# Patient Record
Sex: Female | Born: 1990 | Race: Black or African American | Hispanic: No | Marital: Single | Smoking: Never smoker
Health system: Southern US, Community
[De-identification: ages and names within clinical notes are randomized; demographics above are authoritative.]

## PROBLEM LIST (undated history)

## (undated) DIAGNOSIS — R188 Other ascites: Secondary | ICD-10-CM

## (undated) DIAGNOSIS — J302 Other seasonal allergic rhinitis: Secondary | ICD-10-CM

## (undated) DIAGNOSIS — M329 Systemic lupus erythematosus, unspecified: Secondary | ICD-10-CM

## (undated) HISTORY — PX: NO PAST SURGERIES: SHX2092

---
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
## 2011-06-04 ENCOUNTER — Encounter: Payer: Self-pay | Admitting: Obstetrics and Gynecology

## 2011-06-10 ENCOUNTER — Ambulatory Visit: Payer: Self-pay | Admitting: Obstetrics and Gynecology

## 2011-06-20 ENCOUNTER — Ambulatory Visit (INDEPENDENT_AMBULATORY_CARE_PROVIDER_SITE_OTHER): Payer: 59 | Admitting: Obstetrics and Gynecology

## 2011-06-20 DIAGNOSIS — Z01419 Encounter for gynecological examination (general) (routine) without abnormal findings: Secondary | ICD-10-CM

## 2011-06-20 DIAGNOSIS — Z202 Contact with and (suspected) exposure to infections with a predominantly sexual mode of transmission: Secondary | ICD-10-CM

## 2015-02-10 ENCOUNTER — Ambulatory Visit (INDEPENDENT_AMBULATORY_CARE_PROVIDER_SITE_OTHER): Payer: Commercial Managed Care - HMO | Admitting: Family Medicine

## 2015-02-10 VITALS — BP 120/72 | HR 104 | Temp 98.7°F | Resp 20 | Ht 66.0 in | Wt 199.2 lb

## 2015-02-10 DIAGNOSIS — M76899 Other specified enthesopathies of unspecified lower limb, excluding foot: Secondary | ICD-10-CM

## 2015-02-10 DIAGNOSIS — S161XXA Strain of muscle, fascia and tendon at neck level, initial encounter: Secondary | ICD-10-CM

## 2015-02-10 DIAGNOSIS — M6580 Other synovitis and tenosynovitis, unspecified site: Secondary | ICD-10-CM | POA: Diagnosis not present

## 2015-02-10 MED ORDER — PREDNISONE 20 MG PO TABS: ORAL_TABLET | ORAL | Status: DC

## 2015-02-10 NOTE — Progress Notes (Signed)
This chart was scribed for Elvina SidleKurt Lauenstein, MD by Stann Oresung-Kai Tsai, medical scribe at [facility].The patient was seen in exam room 5 and the patient's care was started at 9:41 AM.  Patient ID: Caroline Elliott MRN: [id number], DOB: [birth year], 24 y.o. Date of Encounter: 02/10/2015  Primary Physician: Hal MoralesHAYGOOD,VANESSA P, MD  Chief Complaint:  Chief Complaint  Patient presents with   Knee Injury    pinching sensation, last friday    Neck Pain   Arm Pain    fingers swollen     HPI:  Caroline Elliott is a 24 y.o. female who [facility] complaining of multiple joint aches.  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 She noticed aching pain in her knees 4 days after. It was hurting really bad where she can't move it. When she was at work during the past week, she works with children and tried to kneel down, she couldn't get back up. She needed assistance to get back up.   When she goes to sleep, the right side of her neck hurts really bad for past 7 days.   When she bends her left arm, it hurts and noted her fingers swelling. She denies numbness and weakness in arms.   She was brought in by her mother.   Past Medical History  Diagnosis Date   Allergy      Home Meds: Prior to Admission medications   Medication Sig Start Date End Date Taking? Authorizing Provider  cetirizine (ZYRTEC) 1 MG/ML syrup Take by mouth daily.   Yes Historical Provider, MD  Multiple Vitamin (MULTIVITAMIN) tablet Take 1 tablet by mouth daily.   Yes Historical Provider, MD    Allergies:  Allergies  Allergen Reactions   Penicillins     Social History   Social History   Marital Status: Single    Spouse Name: N/A   Number of Children: N/A   Years of Education: N/A   Occupational History   Not on file.   Social History Main Topics   Smoking status: Never Smoker    Smokeless tobacco: Not on file   Alcohol Use: No    Drug Use: No   Sexual Activity: Not on file   Other Topics Concern   Not on file   Social History Narrative   No narrative on file     Review of Systems: Constitutional: negative for fever, chills, night sweats, weight changes, or fatigue  HEENT: negative for vision changes, hearing loss, congestion, rhinorrhea, ST, epistaxis, or sinus pressure Cardiovascular: negative for chest pain or palpitations Respiratory: negative for hemoptysis, wheezing, shortness of breath, or cough Abdominal: negative for abdominal pain, nausea, vomiting, diarrhea, or constipation Dermatological: negative for rash Neurologic: negative for headache, dizziness, or syncope, negative for numbness, weakness Musc: positive for arthralgia (knee, neck), myalgia (left arm)  All other systems reviewed and are otherwise negative with the exception to those above and in the HPI.  Physical Exam: Blood pressure 120/72, pulse 104, temperature 98.7 F (37.1 C), temperature source Oral, resp. rate 20, height 5\' 6"  (1.676 m), weight 199 lb 3.2 oz (90.357 kg), last menstrual period 02/07/2015, SpO2 98 %., Body mass index is 32.17 kg/(m^2). General: Well developed, well nourished, in no acute distress. Head: Normocephalic, atraumatic, eyes without discharge, sclera non-icteric, nares are without discharge. Bilateral auditory canals clear, TM's are without perforation, pearly grey and translucent with reflective cone of light bilaterally.  Oral cavity moist, posterior pharynx without exudate, erythema, peritonsillar abscess, or post nasal drip.  Neck: Supple. No thyromegaly. No lymphadenopathy.pain with palpation in paraspinal region of right lower neck. Patient reluctant to hyperextend the neck as well. Lungs: Clear bilaterally to auscultation without wheezes, rales, or rhonchi. Breathing is unlabored. Heart: RRR with S1 S2. No murmurs, rubs, or gallops appreciated. Abdomen: Soft, non-tender, non-distended with normoactive  bowel sounds. No hepatomegaly. No rebound/guarding. No obvious abdominal masses. Msk:  Strength normal for age. Good range of motion of knees although she does is slowly Extremities/Skin: Warm and dry. No clubbing or cyanosis.  Neuro: Alert and oriented X 3. Moves all extremities spontaneously. Gait is normal. CNII-XII grossly in tact. Psych:  Responds to questions appropriately with a normal affect.    ASSESSMENT AND PLAN:  24 y.o. year old female with  This chart was scribed in my presence and reviewed by me personally.    ICD-9-CM ICD-10-CM   1. Neck strain, initial encounter 847.0 S16.1XXA predniSONE (DELTASONE) 20 MG tablet  2. Tendonitis of knee 727.09 M65.80 predniSONE (DELTASONE) 20 MG tablet      By signing my name below, I, Stann Ore, attest that this documentation has been prepared under the direction and in the presence of Elvina Sidle, MD. Electronically Signed: Stann Ore, Scribe. 02/10/2015 , 9:41 AM .  Signed, Elvina Sidle, MD 02/10/2015 9:41 AM

## 2015-02-10 NOTE — Patient Instructions (Signed)
I sent the prescriptions to  Your pharmacy.  Please let me know if you're not significantly better tomorrow.  please take the medicine for 5 days to completely stopped inflammation in the joints of your neck and knees.

## 2015-08-23 ENCOUNTER — Ambulatory Visit (INDEPENDENT_AMBULATORY_CARE_PROVIDER_SITE_OTHER): Payer: Commercial Managed Care - HMO | Admitting: Physician Assistant

## 2015-08-23 VITALS — BP 124/82 | HR 98 | Temp 98.2°F | Resp 18 | Ht 66.0 in | Wt 170.0 lb

## 2015-08-23 DIAGNOSIS — H109 Unspecified conjunctivitis: Secondary | ICD-10-CM | POA: Diagnosis not present

## 2015-08-23 DIAGNOSIS — J302 Other seasonal allergic rhinitis: Secondary | ICD-10-CM | POA: Insufficient documentation

## 2015-08-23 DIAGNOSIS — Z6827 Body mass index (BMI) 27.0-27.9, adult: Secondary | ICD-10-CM | POA: Insufficient documentation

## 2015-08-23 DIAGNOSIS — K219 Gastro-esophageal reflux disease without esophagitis: Secondary | ICD-10-CM | POA: Insufficient documentation

## 2015-08-23 NOTE — Patient Instructions (Addendum)
Continue wearing your glasses until the symptoms resolve. If you continue to have symptoms on Monday, please contact your eye specialist. If you develop colored drainage from your eye (thick, gooey, green/yellow), please let me know.  Drink lots of water. You may use Naproxen to help reduce the aching. Use OTC Systane Ultra rewetting drops to help soothe the eye.    IF you received an x-ray today, you will receive an invoice from [facility]. Please contact [facility] at [phone] with questions or concerns regarding your invoice.   IF you received labwork today, you will receive an invoice from [facility]. Please contact [facility] at [phone] with questions or concerns regarding your invoice.   Our billing staff will not be able to assist you with questions regarding bills from these companies.  You will be contacted with the lab results as soon as they are available. The fastest way to get your results is to activate your My Chart account. Instructions are located on the last page of this paperwork. If you have not heard from us regarding the results in 2 weeks, please contact this office.

## 2015-08-23 NOTE — Progress Notes (Signed)
Patient ID: Caroline Elliott, female    DOB: Jun 22, 1990, 25 y.o.   MRN: [id number]  PCP: Hal MoralesHAYGOOD,VANESSA P, MD  Subjective:   Chief Complaint  Patient presents with  . Conjunctivitis    3 DAYS    HPI Presents for evaluation of LEFT eye redness.  Since Tuesday morning (2 days ago) noticed a throbbing sensation around the LEFT globe and it was watering. Did not insert her contacts. Used some eye drops for the redness. Yesterday at work someone commented that her eye looked really red. "Achy." "Throbbing" around the orbit. Continues to drain clear fluid, like tears.  No fever, chills. Some coughing last night, but no other URI-type symptoms.  No recalled eye injury, FB or extended wear of contact lenses.     Review of Systems As above.    Patient Active Problem List   Diagnosis Date Noted  . Seasonal allergies 08/23/2015  . BMI 27.0-27.9,adult 08/23/2015  . GERD (gastroesophageal reflux disease) 08/23/2015     Prior to Admission medications   Medication Sig Start Date End Date Taking? Authorizing Provider  cetirizine (ZYRTEC) 10 MG tablet Take 10 mg by mouth daily.   Yes Historical Provider, MD  Multiple Vitamin (MULTIVITAMIN) tablet Take 1 tablet by mouth daily.   Yes Historical Provider, MD  naproxen sodium (ANAPROX) 550 MG tablet Take 550 mg by mouth every 12 (twelve) hours. Use PRN menstrual cramps 07/03/15   Historical Provider, MD  omeprazole (PRILOSEC) 40 MG capsule Take 40 mg by mouth 2 (two) times daily. 08/17/15   Historical Provider, MD     Allergies  Allergen Reactions  . Penicillins        Objective:  Physical Exam  Constitutional: She is oriented to person, place, and time. She appears well-developed and well-nourished. She is active and cooperative. No distress.  BP 124/82 mmHg  Pulse 98  Temp(Src) 98.2 F (36.8 C) (Oral)  Resp 18  Ht 5\' 6"  (1.676 m)  Wt 170 lb (77.111 kg)  BMI 27.45 kg/m2  SpO2 99%  LMP 08/14/2015   HENT:  Head:  Normocephalic and atraumatic.  Right Ear: Hearing, tympanic membrane, external ear and ear canal normal.  Left Ear: Hearing, tympanic membrane, external ear and ear canal normal.  Nose: Nose normal.  Mouth/Throat: Uvula is midline, oropharynx is clear and moist and mucous membranes are normal. No oral lesions. Normal dentition.  During irrigation of the eye, the gauze caught the patient's nasal piercing and pulled it out of the nare. Pressure applied. The patient was provided alcohol prep pad to clean the jewelry and she re-inserted it without difficulty.  Eyes: EOM and lids are normal. Pupils are equal, round, and reactive to light. Right eye exhibits no chemosis, no discharge, no exudate and no hordeolum. No foreign body present in the right eye. Left eye exhibits no discharge, no exudate and no hordeolum. No foreign body present in the left eye. Right conjunctiva is not injected. Right conjunctiva has no hemorrhage. Left conjunctiva is injected. Left conjunctiva has no hemorrhage. No scleral icterus.  Fundoscopic exam:      The right eye shows no hemorrhage and no papilledema. The right eye shows red reflex.       The left eye shows no hemorrhage and no papilledema. The left eye shows red reflex.  Verbal consent obtained.  The eye was anesthetized with 1 drop of proparacaine, and stained with fluorescein. Examination under woods lamp does not reveal a foreign body or area of increased  stain uptake.  The eye was then irrigated copiously with saline.   Pulmonary/Chest: Effort normal.  Neurological: She is alert and oriented to person, place, and time.  Psychiatric: She has a normal mood and affect. Her speech is normal and behavior is normal.      Visual Acuity Screening   Right eye Left eye Both eyes  Without correction:     With correction:         Assessment & Plan:   1. Conjunctivitis of left eye Suspect dry eye condition. Continue wearing glasses in lieu of  contacts until her symptoms resolve. Systane Ultra. Naproxen. Hydrate. If she develops thick colored drainage, let me know, as she may warrant antibiotic drops, but at this time there is no infection. If her symptoms persist throughout the weekend, contact her eye specialist on 08/27/15.   Fernande Bras, PA-C Physician Assistant-Certified [facility]

## 2015-08-23 NOTE — Progress Notes (Deleted)
[facility] [address], [place] [address] [phone]  Date:  08/23/2015   Name:  Caroline PootMichelle N Elliott   DOB:  1990-06-08   MRN:  [id number]  PCP:  Caroline MoralesHAYGOOD,VANESSA P, MD    Chief Complaint: Conjunctivitis   History of Present Illness:  This is a 25 y.o. female who is presenting with "eye infection" x 3 days.  Review of Systems:  Review of Systems See HPI  There are no active problems to display for this patient.   Prior to Admission medications   Medication Sig Start Date End Date Taking? Authorizing Provider  cetirizine (ZYRTEC) 1 MG/ML syrup Take by mouth daily.   Yes Historical Provider, MD  Multiple Vitamin (MULTIVITAMIN) tablet Take 1 tablet by mouth daily.   Yes Historical Provider, MD    Allergies  Allergen Reactions  . Penicillins     History reviewed. No pertinent past surgical history.  Social History  Substance Use Topics  . Smoking status: Never Smoker   . Smokeless tobacco: None  . Alcohol Use: No    Family History  Problem Relation Age of Onset  . Stroke Father     Medication list has been reviewed and updated.  Physical Examination:  Physical Exam  BP 124/82 mmHg  Pulse 98  Temp(Src) 98.2 F (36.8 C) (Oral)  Resp 18  Ht 5\' 6"  (1.676 m)  Wt 170 lb (77.111 kg)  BMI 27.45 kg/m2  SpO2 99%  LMP 08/14/2015  Assessment and Plan:

## 2015-09-14 ENCOUNTER — Other Ambulatory Visit (HOSPITAL_COMMUNITY): Payer: Self-pay | Admitting: Gastroenterology

## 2015-09-14 DIAGNOSIS — R6881 Early satiety: Secondary | ICD-10-CM

## 2015-09-14 DIAGNOSIS — R634 Abnormal weight loss: Secondary | ICD-10-CM

## 2015-09-20 ENCOUNTER — Ambulatory Visit (HOSPITAL_COMMUNITY): Admission: RE | Admit: 2015-09-20 | Payer: Commercial Managed Care - HMO | Source: Ambulatory Visit

## 2015-10-09 ENCOUNTER — Emergency Department (HOSPITAL_COMMUNITY)
Admission: EM | Admit: 2015-10-09 | Discharge: 2015-10-09 | Disposition: A | Discharge status: Home / Self Care | Payer: Commercial Managed Care - HMO | Source: Home / Self Care | Attending: Emergency Medicine | Admitting: Emergency Medicine

## 2015-10-09 ENCOUNTER — Encounter (HOSPITAL_COMMUNITY): Payer: Self-pay | Admitting: Emergency Medicine

## 2015-10-09 DIAGNOSIS — K219 Gastro-esophageal reflux disease without esophagitis: Secondary | ICD-10-CM

## 2015-10-09 DIAGNOSIS — R188 Other ascites: Secondary | ICD-10-CM | POA: Diagnosis not present

## 2015-10-09 DIAGNOSIS — Z79899 Other long term (current) drug therapy: Secondary | ICD-10-CM | POA: Insufficient documentation

## 2015-10-09 DIAGNOSIS — M3214 Glomerular disease in systemic lupus erythematosus: Secondary | ICD-10-CM | POA: Diagnosis not present

## 2015-10-09 LAB — LIPASE, BLOOD: Lipase: 20 U/L (ref 11–51)

## 2015-10-09 LAB — COMPREHENSIVE METABOLIC PANEL WITH GFR
ALT: 10 U/L — ABNORMAL LOW (ref 14–54)
AST: 18 U/L (ref 15–41)
Albumin: 2 g/dL — ABNORMAL LOW (ref 3.5–5.0)
Alkaline Phosphatase: 48 U/L (ref 38–126)
Anion gap: 5 (ref 5–15)
BUN: 12 mg/dL (ref 6–20)
CO2: 25 mmol/L (ref 22–32)
Calcium: 7.7 mg/dL — ABNORMAL LOW (ref 8.9–10.3)
Chloride: 108 mmol/L (ref 101–111)
Creatinine, Ser: 0.94 mg/dL (ref 0.44–1.00)
GFR calc Af Amer: >60 mL/min
GFR calc non Af Amer: >60 mL/min
Glucose, Bld: 91 mg/dL (ref 65–99)
Potassium: 3.4 mmol/L — ABNORMAL LOW (ref 3.5–5.1)
Sodium: 138 mmol/L (ref 135–145)
Total Bilirubin: 0.5 mg/dL (ref 0.3–1.2)
Total Protein: 5.4 g/dL — ABNORMAL LOW (ref 6.5–8.1)

## 2015-10-09 LAB — CBC WITH DIFFERENTIAL/PLATELET
Basophils Absolute: 0 K/uL (ref 0.0–0.1)
Basophils Relative: 0 %
Eosinophils Absolute: 0 K/uL (ref 0.0–0.7)
Eosinophils Relative: 0 %
HCT: 30.5 % — ABNORMAL LOW (ref 36.0–46.0)
Hemoglobin: 10.4 g/dL — ABNORMAL LOW (ref 12.0–15.0)
Lymphocytes Relative: 21 %
Lymphs Abs: 0.8 K/uL (ref 0.7–4.0)
MCH: 27.2 pg (ref 26.0–34.0)
MCHC: 34.1 g/dL (ref 30.0–36.0)
MCV: 79.8 fL (ref 78.0–100.0)
Monocytes Absolute: 0.3 K/uL (ref 0.1–1.0)
Monocytes Relative: 8 %
Neutro Abs: 2.5 K/uL (ref 1.7–7.7)
Neutrophils Relative %: 71 %
Platelets: 210 K/uL (ref 150–400)
RBC: 3.82 MIL/uL — ABNORMAL LOW (ref 3.87–5.11)
RDW: 14.1 % (ref 11.5–15.5)
WBC: 3.6 K/uL — ABNORMAL LOW (ref 4.0–10.5)

## 2015-10-09 LAB — URINE MICROSCOPIC-ADD ON

## 2015-10-09 LAB — URINALYSIS, ROUTINE W REFLEX MICROSCOPIC
Bilirubin Urine: NEGATIVE
Glucose, UA: NEGATIVE mg/dL
Ketones, ur: NEGATIVE mg/dL
Leukocytes, UA: NEGATIVE
Nitrite: NEGATIVE
Protein, ur: >300 mg/dL — AB
Specific Gravity, Urine: 1.021 (ref 1.005–1.030)
pH: 7.5 (ref 5.0–8.0)

## 2015-10-09 LAB — PREGNANCY, URINE: Preg Test, Ur: NEGATIVE

## 2015-10-09 MED ORDER — ONDANSETRON 4 MG PO TBDP: 4.0000 mg | ORAL_TABLET | Freq: Three times a day (TID) | ORAL | Status: AC | PRN

## 2015-10-09 MED ORDER — GI COCKTAIL ~~LOC~~
30.0000 mL | Freq: Once | ORAL | Status: AC
  Administered 2015-10-09: 30 mL via ORAL
  Filled 2015-10-09: qty 30

## 2015-10-09 MED ORDER — FAMOTIDINE IN NACL 20-0.9 MG/50ML-% IV SOLN
20.0000 mg | Freq: Once | INTRAVENOUS | Status: AC
  Administered 2015-10-09: 20 mg via INTRAVENOUS
  Filled 2015-10-09: qty 50

## 2015-10-09 MED ORDER — MORPHINE SULFATE (PF) 2 MG/ML IV SOLN
2.0000 mg | Freq: Once | INTRAVENOUS | Status: AC
  Administered 2015-10-09: 2 mg via INTRAVENOUS
  Filled 2015-10-09: qty 1

## 2015-10-09 MED ORDER — ONDANSETRON HCL 4 MG/2ML IJ SOLN
4.0000 mg | Freq: Once | INTRAMUSCULAR | Status: AC
  Administered 2015-10-09: 4 mg via INTRAVENOUS
  Filled 2015-10-09: qty 2

## 2015-10-09 MED ORDER — SUCRALFATE 1 GM/10ML PO SUSP: 1.0000 g | Freq: Three times a day (TID) | ORAL | Status: DC

## 2015-10-09 MED ORDER — SUCRALFATE 1 G PO TABS
1.0000 g | ORAL_TABLET | Freq: Once | ORAL | Status: AC
  Administered 2015-10-09: 1 g via ORAL
  Filled 2015-10-09: qty 1

## 2015-10-09 MED ORDER — RANITIDINE HCL 150 MG PO TABS: 150.0000 mg | ORAL_TABLET | Freq: Two times a day (BID) | ORAL | Status: AC
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
## 2015-10-09 NOTE — ED Notes (Signed)
Given last RX and pt is discharge vis wheelchair

## 2015-10-09 NOTE — ED Notes (Signed)
Holding discharge - pending additional RX and medication

## 2015-10-09 NOTE — Discharge Instructions (Signed)

## 2015-10-09 NOTE — ED Notes (Signed)
Patient states that lower back was hurting and she took muscle relaxer (Flexiril) around 2100 on 10/08/2015. Patient states that she started getting a burning sensation in mid abdomen. Then she started throwing up. Pain is in her mid abdomen right now.

## 2015-10-09 NOTE — ED Notes (Signed)
ED PA at bedside

## 2015-10-11 ENCOUNTER — Other Ambulatory Visit: Payer: Self-pay | Admitting: Gastroenterology

## 2015-10-11 ENCOUNTER — Inpatient Hospital Stay (HOSPITAL_COMMUNITY)
Admission: AD | Admit: 2015-10-11 | Discharge: 2015-10-24 | DRG: 545 | Disposition: A | Discharge status: Other Acute Inpatient Hospital | Payer: Commercial Managed Care - HMO | Source: Ambulatory Visit | Attending: Internal Medicine | Admitting: Internal Medicine

## 2015-10-11 ENCOUNTER — Ambulatory Visit
Admission: RE | Admit: 2015-10-11 | Discharge: 2015-10-11 | Disposition: A | Discharge status: Home / Self Care | Payer: Commercial Managed Care - HMO | Source: Ambulatory Visit | Attending: Gastroenterology | Admitting: Gastroenterology

## 2015-10-11 ENCOUNTER — Encounter (HOSPITAL_COMMUNITY): Payer: Self-pay | Admitting: General Practice

## 2015-10-11 DIAGNOSIS — K567 Ileus, unspecified: Secondary | ICD-10-CM | POA: Diagnosis present

## 2015-10-11 DIAGNOSIS — R1011 Right upper quadrant pain: Secondary | ICD-10-CM

## 2015-10-11 DIAGNOSIS — E860 Dehydration: Secondary | ICD-10-CM | POA: Diagnosis present

## 2015-10-11 DIAGNOSIS — R140 Abdominal distension (gaseous): Secondary | ICD-10-CM

## 2015-10-11 DIAGNOSIS — R1110 Vomiting, unspecified: Secondary | ICD-10-CM

## 2015-10-11 DIAGNOSIS — Z880 Allergy status to penicillin: Secondary | ICD-10-CM

## 2015-10-11 DIAGNOSIS — R1013 Epigastric pain: Secondary | ICD-10-CM | POA: Diagnosis present

## 2015-10-11 DIAGNOSIS — Z888 Allergy status to other drugs, medicaments and biological substances status: Secondary | ICD-10-CM

## 2015-10-11 DIAGNOSIS — K219 Gastro-esophageal reflux disease without esophagitis: Secondary | ICD-10-CM

## 2015-10-11 DIAGNOSIS — R110 Nausea: Secondary | ICD-10-CM

## 2015-10-11 DIAGNOSIS — M3214 Glomerular disease in systemic lupus erythematosus: Secondary | ICD-10-CM

## 2015-10-11 DIAGNOSIS — R188 Other ascites: Secondary | ICD-10-CM | POA: Diagnosis present

## 2015-10-11 DIAGNOSIS — Y848 Other medical procedures as the cause of abnormal reaction of the patient, or of later complication, without mention of misadventure at the time of the procedure: Secondary | ICD-10-CM | POA: Diagnosis not present

## 2015-10-11 DIAGNOSIS — K5900 Constipation, unspecified: Secondary | ICD-10-CM

## 2015-10-11 DIAGNOSIS — R112 Nausea with vomiting, unspecified: Secondary | ICD-10-CM | POA: Diagnosis present

## 2015-10-11 DIAGNOSIS — K529 Noninfective gastroenteritis and colitis, unspecified: Secondary | ICD-10-CM | POA: Diagnosis not present

## 2015-10-11 DIAGNOSIS — T508X5A Adverse effect of diagnostic agents, initial encounter: Secondary | ICD-10-CM | POA: Diagnosis not present

## 2015-10-11 DIAGNOSIS — R1012 Left upper quadrant pain: Secondary | ICD-10-CM

## 2015-10-11 DIAGNOSIS — N179 Acute kidney failure, unspecified: Secondary | ICD-10-CM | POA: Diagnosis not present

## 2015-10-11 DIAGNOSIS — N170 Acute kidney failure with tubular necrosis: Secondary | ICD-10-CM | POA: Diagnosis present

## 2015-10-11 DIAGNOSIS — K5909 Other constipation: Secondary | ICD-10-CM | POA: Diagnosis present

## 2015-10-11 DIAGNOSIS — R591 Generalized enlarged lymph nodes: Secondary | ICD-10-CM | POA: Diagnosis present

## 2015-10-11 DIAGNOSIS — Z4659 Encounter for fitting and adjustment of other gastrointestinal appliance and device: Secondary | ICD-10-CM

## 2015-10-11 DIAGNOSIS — E875 Hyperkalemia: Secondary | ICD-10-CM | POA: Diagnosis present

## 2015-10-11 HISTORY — DX: Other ascites: R18.8

## 2015-10-11 HISTORY — DX: Other seasonal allergic rhinitis: J30.2

## 2015-10-11 LAB — C-REACTIVE PROTEIN: CRP: <0.5 mg/dL (ref <1.0)

## 2015-10-11 LAB — COMPREHENSIVE METABOLIC PANEL
ALK PHOS: 51 U/L (ref 38–126)
ALT: 9 U/L — AB (ref 14–54)
AST: 25 U/L (ref 15–41)
Albumin: 2 g/dL — ABNORMAL LOW (ref 3.5–5.0)
Anion gap: 6 (ref 5–15)
BILIRUBIN TOTAL: 0.4 mg/dL (ref 0.3–1.2)
BUN: 34 mg/dL — AB (ref 6–20)
CALCIUM: 8.4 mg/dL — AB (ref 8.9–10.3)
CHLORIDE: 104 mmol/L (ref 101–111)
CO2: 27 mmol/L (ref 22–32)
CREATININE: 1.51 mg/dL — AB (ref 0.44–1.00)
GFR, EST AFRICAN AMERICAN: 55 mL/min — AB (ref >60)
GFR, EST NON AFRICAN AMERICAN: 48 mL/min — AB (ref >60)
Glucose, Bld: 103 mg/dL — ABNORMAL HIGH (ref 65–99)
Potassium: 4.1 mmol/L (ref 3.5–5.1)
Sodium: 137 mmol/L (ref 135–145)
TOTAL PROTEIN: 5.6 g/dL — AB (ref 6.5–8.1)

## 2015-10-11 LAB — CBC WITH DIFFERENTIAL/PLATELET
BASOS ABS: 0 10*3/uL (ref 0.0–0.1)
BASOS PCT: 1 %
EOS ABS: 0 10*3/uL (ref 0.0–0.7)
Eosinophils Relative: 0 %
HCT: 40.1 % (ref 36.0–46.0)
Hemoglobin: 13.5 g/dL (ref 12.0–15.0)
Lymphocytes Relative: 17 %
Lymphs Abs: 0.9 10*3/uL (ref 0.7–4.0)
MCH: 27 pg (ref 26.0–34.0)
MCHC: 33.7 g/dL (ref 30.0–36.0)
MCV: 80.2 fL (ref 78.0–100.0)
Monocytes Absolute: 0.4 10*3/uL (ref 0.1–1.0)
Monocytes Relative: 7 %
Neutro Abs: 3.7 10*3/uL (ref 1.7–7.7)
Neutrophils Relative %: 74 %
PLATELETS: 208 10*3/uL (ref 150–400)
RBC: 5 MIL/uL (ref 3.87–5.11)
RDW: 14.2 % (ref 11.5–15.5)
WBC: 5 10*3/uL (ref 4.0–10.5)

## 2015-10-11 LAB — TSH: TSH: 2.92 u[IU]/mL (ref 0.350–4.500)

## 2015-10-11 LAB — MAGNESIUM: MAGNESIUM: 1.8 mg/dL (ref 1.7–2.4)

## 2015-10-11 LAB — SEDIMENTATION RATE: SED RATE: 27 mm/h — AB (ref 0–22)

## 2015-10-11 MED ORDER — ENSURE ENLIVE PO LIQD: 237.0000 mL | Freq: Two times a day (BID) | ORAL | Status: DC

## 2015-10-11 MED ORDER — IOPAMIDOL (ISOVUE-300) INJECTION 61%
100.0000 mL | Freq: Once | INTRAVENOUS | Status: AC | PRN
  Administered 2015-10-11: 100 mL via INTRAVENOUS

## 2015-10-11 MED ORDER — PROCHLORPERAZINE EDISYLATE 5 MG/ML IJ SOLN
10.0000 mg | INTRAMUSCULAR | Status: DC | PRN
  Administered 2015-10-11: 10 mg via INTRAVENOUS
  Filled 2015-10-11 (×2): qty 2

## 2015-10-11 MED ORDER — SODIUM CHLORIDE 0.9 % IV SOLN
INTRAVENOUS | Status: DC
  Administered 2015-10-11: 1 mL via INTRAVENOUS
  Administered 2015-10-12: 11:00:00 via INTRAVENOUS

## 2015-10-11 MED ORDER — FAMOTIDINE IN NACL 20-0.9 MG/50ML-% IV SOLN
20.0000 mg | Freq: Two times a day (BID) | INTRAVENOUS | Status: DC
  Administered 2015-10-12 – 2015-10-16 (×10): 20 mg via INTRAVENOUS
  Filled 2015-10-11 (×13): qty 50

## 2015-10-11 MED ORDER — BOOST / RESOURCE BREEZE PO LIQD: 1.0000 | Freq: Three times a day (TID) | ORAL | Status: DC

## 2015-10-11 MED ORDER — ENOXAPARIN SODIUM 40 MG/0.4ML ~~LOC~~ SOLN: 40.0000 mg | SUBCUTANEOUS | Status: DC

## 2015-10-11 MED ORDER — ACETAMINOPHEN 325 MG PO TABS: 650.0000 mg | ORAL_TABLET | Freq: Four times a day (QID) | ORAL | Status: DC | PRN

## 2015-10-11 MED ORDER — ACETAMINOPHEN 650 MG RE SUPP: 650.0000 mg | Freq: Four times a day (QID) | RECTAL | Status: DC | PRN

## 2015-10-11 MED ORDER — ONDANSETRON HCL 4 MG/2ML IJ SOLN: 4.0000 mg | Freq: Four times a day (QID) | INTRAMUSCULAR | Status: DC | PRN

## 2015-10-11 MED ORDER — ONDANSETRON HCL 4 MG PO TABS: 4.0000 mg | ORAL_TABLET | Freq: Four times a day (QID) | ORAL | Status: DC | PRN

## 2015-10-11 MED ORDER — CIPROFLOXACIN IN D5W 400 MG/200ML IV SOLN
400.0000 mg | Freq: Two times a day (BID) | INTRAVENOUS | Status: DC
  Administered 2015-10-11 – 2015-10-12 (×2): 400 mg via INTRAVENOUS
  Filled 2015-10-11 (×3): qty 200

## 2015-10-11 MED ORDER — SODIUM CHLORIDE 0.9 % IV BOLUS (SEPSIS)
1000.0000 mL | Freq: Once | INTRAVENOUS | Status: AC
  Administered 2015-10-11: 1000 mL via INTRAVENOUS

## 2015-10-11 MED ORDER — ALBUTEROL SULFATE (2.5 MG/3ML) 0.083% IN NEBU
2.5000 mg | INHALATION_SOLUTION | RESPIRATORY_TRACT | Status: DC | PRN
Start: 2015-10-11 — End: 2015-10-12

## 2015-10-11 MED ORDER — METRONIDAZOLE IN NACL 5-0.79 MG/ML-% IV SOLN
500.0000 mg | Freq: Three times a day (TID) | INTRAVENOUS | Status: DC
  Administered 2015-10-11 – 2015-10-12 (×3): 500 mg via INTRAVENOUS
  Filled 2015-10-11 (×5): qty 100

## 2015-10-11 MED ORDER — MORPHINE SULFATE (PF) 2 MG/ML IV SOLN
1.0000 mg | INTRAVENOUS | Status: DC | PRN
  Administered 2015-10-11 – 2015-10-22 (×19): 2 mg via INTRAVENOUS
  Filled 2015-10-11 (×20): qty 1

## 2015-10-11 NOTE — H&P (Addendum)
History and Physical    AMYRIAH BURAS HEN:[id number] DOB: 01-16-1991 DOA: 10/11/2015  Referring MD/NP/PA: Dr. Collene Mares PCP: Eldred Manges, MD  Patient coming from: Home  Chief Complaint: Abdominal pain with nausea and vomiting  HPI: Caroline Elliott is a 25 y.o. female with medical history significant of GERD and allergies; who presents with complaints of abdominal pain with nausea and vomiting. Symptoms have been intermittent and initially started in 04/2015. Abdominal pain is located epigastrically and described as a dull achy pain. Currently, rates pain 10 out of 10 on the pain scale. Previous episodes of abdominal pain with nausea and vomiting lasting 2-3 days and then spontaneously resolving. She was seen by a gastroenterologist sometime last month and that time was diagnosed with acid reflux for which she was prescribed omeprazole but at allergic reaction and therefore has been taking Zantac intermittently. Symptoms progressively worsened 2 days ago which the patient came into the emergency department. Given IV Pepcid, Zofran, morphine as well as a GI cocktail, and carafate prior to discharge. No imaging studies were done at that time. She had a follow-up appointment with another GI doctor scheduled for next week. Associated symptoms include constipation, bloating, weight loss, lightheadedness, excessively dry skin.  Last bowel movement was yesterday, but she only states having 1-2 bowel movements per week.  She denies having any melena, hematochezia, fevers, rash, loss of consciousness, chills, dysuria, or diarrhea.  she denies any significant family history of any inflammatory bowel disease. Patient does note taking naproxen and muscle relaxant for back pain intermittently  ED Course: Called by Dr. Collene Mares requesting direct admission to a MedSurg bed after CT scan of the abdomen pelvis revealed long segment of small bowel wall thickening in the right mid abdomen an additional wall thickening  involving the ascending colon to suggest infectious or inflammatory enterocolitis. Review of previous labs from 7/4 revealed a WBC 3.6, hemoglobin 10.4, platelets 210, potassium 3.4, calcium 7.7, albumin 2.   Review of Systems: As per HPI otherwise 10 point review of systems negative.   Past Medical History  Diagnosis Date  . Allergy     Past Surgical History  Procedure Laterality Date  . No past surgeries       reports that she has never smoked. She has never used smokeless tobacco. She reports that she drinks alcohol. She reports that she does not use illicit drugs.  Allergies  Allergen Reactions  . Penicillins Hives and Other (See Comments)    Has patient had a PCN reaction causing immediate rash, facial/tongue/throat swelling, SOB or lightheadedness with hypotension: No Has patient had a PCN reaction causing severe rash involving mucus membranes or skin necrosis: No Has patient had a PCN reaction that required hospitalization Yes Has patient had a PCN reaction occurring within the last 10 years: No If all of the above answers are "NO", then may proceed with Cephalosporin use.  Marland Kitchen Omeprazole Magnesium Rash    All over body. Pt. followed up with doctor post reaction.    Family History  Problem Relation Age of Onset  . Hypertension Father     Prior to Admission medications   Medication Sig Start Date End Date Taking? Authorizing Provider  cetirizine (ZYRTEC) 10 MG tablet Take 10 mg by mouth daily.    Historical Provider, MD  cyclobenzaprine (FLEXERIL) 5 MG tablet Take 5 mg by mouth 3 (three) times daily as needed for muscle spasms. For muscle spasm. 10/03/15   Historical Provider, MD  ondansetron (ZOFRAN ODT) 4  MG disintegrating tablet Take 1 tablet (4 mg total) by mouth every 8 (eight) hours as needed for nausea or vomiting. 10/09/15   Samantha Tripp Dowless, PA-C  ranitidine (ZANTAC) 150 MG tablet Take 1 tablet (150 mg total) by mouth 2 (two) times daily. 10/09/15   Samantha Tripp  Dowless, PA-C  sucralfate (CARAFATE) 1 GM/10ML suspension Take 10 mLs (1 g total) by mouth 4 (four) times daily -  with meals and at bedtime. 10/09/15   Carlos Levering, PA-C    Physical Exam: Constitutional: Young female who appears acutely sick  Filed Vitals:   10/11/15 2013 10/12/15 0516  BP: 121/101 130/93  Pulse: 102 105  Temp: 98.8 F (37.1 C) 98 F (36.7 C)  TempSrc: Oral Oral  Height: '5\' 4"'$  (1.626 m)   Weight: 71.668 kg (158 lb)   SpO2: 99% 99%   Eyes: PERRL, lids and conjunctivae normal ENMT: Mucous membranes are dry. Posterior pharynx clear of any exudate or lesions.Normal dentition.  Neck: normal, supple, shotty lymphadenopathy noted of the neck bilaterally Respiratory: clear to auscultation bilaterally, no wheezing, no crackles. Normal respiratory effort. No accessory muscle use.  Cardiovascular: Tachycardic. no murmurs / rubs / gallops. No extremity edema. 2+ pedal pulses. No carotid bruits.  Abdomen: Epigastric tenderness to palpation, positive fluid wave. No hepatosplenomegaly. Bowel sounds positive.  Musculoskeletal: no clubbing / cyanosis. No joint deformity upper and lower extremities. Good ROM, no contractures. Normal muscle tone.  Skin: no rashes, presumed cold sore present on the lower lip, ulcers. No induration. Skin of the distal extremities extremely dry. Neurologic: CN 2-12 grossly intact. Sensation intact, DTR normal. Strength 5/5 in all 4.  Psychiatric: Normal judgment and insight. Alert and oriented x 3. Normal mood.     Labs on Admission: I have personally reviewed following labs and imaging studies  CBC:  Recent Labs Lab 10/09/15 0740  WBC 3.6*  NEUTROABS 2.5  HGB 10.4*  HCT 30.5*  MCV 79.8  PLT 606   Basic Metabolic Panel:  Recent Labs Lab 10/09/15 0740  NA 138  K 3.4*  CL 108  CO2 25  GLUCOSE 91  BUN 12  CREATININE 0.94  CALCIUM 7.7*   GFR: Estimated Creatinine Clearance: 91 mL/min (by C-G formula based on Cr of  0.94). Liver Function Tests:  Recent Labs Lab 10/09/15 0740  AST 18  ALT 10*  ALKPHOS 48  BILITOT 0.5  PROT 5.4*  ALBUMIN 2.0*    Recent Labs Lab 10/09/15 0740  LIPASE 20   No results for input(s): AMMONIA in the last 168 hours. Coagulation Profile: No results for input(s): INR, PROTIME in the last 168 hours. Cardiac Enzymes: No results for input(s): CKTOTAL, CKMB, CKMBINDEX, TROPONINI in the last 168 hours. BNP (last 3 results) No results for input(s): PROBNP in the last 8760 hours. HbA1C: No results for input(s): HGBA1C in the last 72 hours. CBG: No results for input(s): GLUCAP in the last 168 hours. Lipid Profile: No results for input(s): CHOL, HDL, LDLCALC, TRIG, CHOLHDL, LDLDIRECT in the last 72 hours. Thyroid Function Tests: No results for input(s): TSH, T4TOTAL, FREET4, T3FREE, THYROIDAB in the last 72 hours. Anemia Panel: No results for input(s): VITAMINB12, FOLATE, FERRITIN, TIBC, IRON, RETICCTPCT in the last 72 hours. Urine analysis:    Component Value Date/Time   COLORURINE YELLOW 10/09/2015 0914   APPEARANCEUR CLOUDY* 10/09/2015 0914   LABSPEC 1.021 10/09/2015 0914   PHURINE 7.5 10/09/2015 0914   GLUCOSEU NEGATIVE 10/09/2015 0914   HGBUR MODERATE* 10/09/2015 0914  [place] NEGATIVE 10/09/2015 0914   KETONESUR NEGATIVE 10/09/2015 0914   PROTEINUR >300* 10/09/2015 0914   UROBILINOGEN 0.2 07/22/2007 0504   NITRITE NEGATIVE 10/09/2015 0914   LEUKOCYTESUR NEGATIVE 10/09/2015 0914   Sepsis Labs: No results found for this or any previous visit (from the past 240 hour(s)).   Radiological Exams on Admission: Ct Abdomen Pelvis W Contrast  10/11/2015  ADDENDUM REPORT: 10/11/2015 18:11 ADDENDUM: These results were called by telephone at the time of interpretation on 10/11/2015 at 5:50 pm to Dr. Juanita Craver , who verbally acknowledged these results. Although the the patient's young age would favor a benign etiology for the findings, it should be noted that the  degree of abdominopelvic ascites is unusual for infectious/inflammatory enteritis. However, pertinent negatives on this study include normal bilateral ovaries, visualized appendix, and pancreas. Consider upper endoscopy for further evaluation, as clinically warranted. Electronically Signed   By: Julian Hy M.D.   On: 10/11/2015 18:11  10/11/2015  CLINICAL DATA:  Upper abdominal pain, nausea/vomiting EXAM: CT ABDOMEN AND PELVIS WITH CONTRAST TECHNIQUE: Multidetector CT imaging of the abdomen and pelvis was performed using the standard protocol following bolus administration of intravenous contrast. CONTRAST:  122m ISOVUE-300 IOPAMIDOL (ISOVUE-300) INJECTION 61% COMPARISON:  None. FINDINGS: Lower chest: 4 mm subpleural nodule in the left lower lobe (series 4/image 5), likely benign. No dedicated follow-up imaging is required given the patient's age. Hepatobiliary: Liver is within normal limits. Gallbladder is unremarkable. No intrahepatic or extrahepatic ductal dilatation. Pancreas: Within normal limits. Spleen: 2.7 cm loculated fluid density lesion along the lateral spleen (series 3/ image 26), possibly reflecting a pseudocyst versus loculated ascites. Adrenals/Urinary Tract: Adrenal glands are within normal limits. Kidneys are within normal limits.  No hydronephrosis. Bladder is underdistended but unremarkable. Stomach/Bowel: Stomach is within normal limits. Wall thickening involving a long segments of small bowel in the mid abdomen (series 3/ images 39 and 49). Additional wall thickening involving ascending colon/cecum (coronal image 54). This appearance suggests infectious or inflammatory enterocolitis. Appendix is within normal limits (series 3/ image 64). Vascular/Lymphatic: No evidence of abdominal aortic aneurysm. No suspicious abdominopelvic lymphadenopathy. Reproductive: Uterus is within normal limits. Bilateral ovaries are within normal limits. Other: Large volume abdominopelvic ascites, mostly  measuring simple fluid density, although possibly minimally complicated by hemorrhage in the dependent pelvis (series 3/image 78). Associated mild body wall edema. Musculoskeletal: Visualized osseous structures are within normal limits. IMPRESSION: Long segment small bowel wall thickening in the right mid abdomen. Additional wall thickening involving the ascending colon/cecum. This appearance suggests infectious or inflammatory enterocolitis. Large volume abdominopelvic ascites, possibly minimally complicated by hemorrhage. Additional ancillary findings as above. These results will be called to the ordering clinician or representative by the Radiology Department at the imaging location. Electronically Signed: By: SJulian HyM.D. On: 10/11/2015 17:33   Ir [place] Vasc Access Right  10/11/2015  INDICATION: 25year old female with abdominal pain, and dehydration. She requires a CT scan of the abdomen and pelvis with intravenous contrast material button IV cannot be obtained secondary to dehydration. Ultrasound-guided IV start is requested. EXAM: IR ULTRASOUND GUIDANCE VASC ACCESS RIGHT MEDICATIONS: None ANESTHESIA/SEDATION: None FLUOROSCOPY TIME:  None COMPLICATIONS: None immediate. PROCEDURE: The right arm was sterilely prepped and draped in standard fashion using chlorhexidine skin prep. A tourniquet was applied. Using sterile technique and ultrasound guidance, the right brachial vein was punctured in the mid upper arm with a 21 gauge micropuncture needle. The needle was exchanged over a micro wire for a 5 [place]  transitional micro sheath. The sheath was advanced in the brachial vein. The sheath was fitted with a valves cap and flushed with sterile saline. The patient tolerated the procedure well. IMPRESSION: Successful ultrasound-guided IV start. Electronically Signed   By: Jacqulynn Cadet M.D.   On: 10/11/2015 17:17     Assessment/Plan Epigastric abdominal pain/enterocolitis: Acute on chronic.  Patient gives this intermittent history of abdominal pain with nausea and vomiting over the last 6 months. CT scan showing inflammation suggestive of enterocolitis. Patient with no family history of any IBD. - Admit to MedSurg bed - Check CBC, CMP, ESR, CRP, TSH, and UDS - Empiric antibiotics of metronidazole and ciprofloxacin - Clear liquid diet as tolerated  - Morphine prn pain  Nausea and vomiting  - Zofran / Compazine prn N/V   Acute kidney injury: Creatinine elevated at 1.51 with a BUN of 34 on admission. - IV fluids normal saline - Recheck BMP in a.m.  Lymphadenopathy: Shotty lymphadenopathy noted of the upper neck - Check HIV and RPR in am due to patient being a hard stick after hydration - May warrant further investigation  Ascites: Acute large amount of ascites noted on CT imaging. Cause is not clearly known, but question poor - Ultrasound-guided paracentesis in a.m.   Hypoalbuminemia: Albumin 2 days ago was 2. - Check prealbumin in a.m   GERD - Pepcid IV for now  DVT prophylaxis:  Lovenox Code Status: Full Family Communication:  Discussed overall plan with the patient's mom and fianc present at bedside Disposition Plan:  Possible discharge 2-4 days  Consults called:  possible discharge in 2-3 days  Admission status: observation  Norval Morton MD [facility] Pager [phone]  If 7PM-7AM, please contact night-coverage [email or web address] Password [email or web address]  10/11/2015, 7:50 PM

## 2015-10-12 ENCOUNTER — Observation Stay (HOSPITAL_COMMUNITY): Payer: Commercial Managed Care - HMO

## 2015-10-12 DIAGNOSIS — R112 Nausea with vomiting, unspecified: Secondary | ICD-10-CM | POA: Diagnosis present

## 2015-10-12 DIAGNOSIS — T508X5A Adverse effect of diagnostic agents, initial encounter: Secondary | ICD-10-CM | POA: Diagnosis not present

## 2015-10-12 DIAGNOSIS — N170 Acute kidney failure with tubular necrosis: Secondary | ICD-10-CM | POA: Diagnosis present

## 2015-10-12 DIAGNOSIS — R591 Generalized enlarged lymph nodes: Secondary | ICD-10-CM | POA: Diagnosis present

## 2015-10-12 DIAGNOSIS — R1110 Vomiting, unspecified: Secondary | ICD-10-CM | POA: Diagnosis not present

## 2015-10-12 DIAGNOSIS — N179 Acute kidney failure, unspecified: Secondary | ICD-10-CM | POA: Diagnosis present

## 2015-10-12 DIAGNOSIS — Y848 Other medical procedures as the cause of abnormal reaction of the patient, or of later complication, without mention of misadventure at the time of the procedure: Secondary | ICD-10-CM | POA: Diagnosis not present

## 2015-10-12 DIAGNOSIS — R188 Other ascites: Secondary | ICD-10-CM | POA: Diagnosis present

## 2015-10-12 DIAGNOSIS — R1013 Epigastric pain: Secondary | ICD-10-CM | POA: Diagnosis not present

## 2015-10-12 DIAGNOSIS — E875 Hyperkalemia: Secondary | ICD-10-CM | POA: Diagnosis present

## 2015-10-12 DIAGNOSIS — K5909 Other constipation: Secondary | ICD-10-CM | POA: Diagnosis present

## 2015-10-12 DIAGNOSIS — E860 Dehydration: Secondary | ICD-10-CM | POA: Diagnosis present

## 2015-10-12 DIAGNOSIS — K567 Ileus, unspecified: Secondary | ICD-10-CM | POA: Diagnosis present

## 2015-10-12 DIAGNOSIS — K529 Noninfective gastroenteritis and colitis, unspecified: Secondary | ICD-10-CM | POA: Diagnosis present

## 2015-10-12 DIAGNOSIS — Z880 Allergy status to penicillin: Secondary | ICD-10-CM | POA: Diagnosis not present

## 2015-10-12 DIAGNOSIS — M3214 Glomerular disease in systemic lupus erythematosus: Secondary | ICD-10-CM | POA: Diagnosis present

## 2015-10-12 DIAGNOSIS — K219 Gastro-esophageal reflux disease without esophagitis: Secondary | ICD-10-CM | POA: Diagnosis present

## 2015-10-12 DIAGNOSIS — Z888 Allergy status to other drugs, medicaments and biological substances status: Secondary | ICD-10-CM | POA: Diagnosis not present

## 2015-10-12 DIAGNOSIS — R140 Abdominal distension (gaseous): Secondary | ICD-10-CM | POA: Diagnosis not present

## 2015-10-12 LAB — GRAM STAIN

## 2015-10-12 LAB — RAPID URINE DRUG SCREEN, HOSP PERFORMED
AMPHETAMINES: NOT DETECTED
BARBITURATES: NOT DETECTED
BENZODIAZEPINES: NOT DETECTED
Cocaine: NOT DETECTED
Opiates: POSITIVE — AB
TETRAHYDROCANNABINOL: NOT DETECTED

## 2015-10-12 LAB — BODY FLUID CELL COUNT WITH DIFFERENTIAL
Eos, Fluid: 0 %
LYMPHS FL: 30 %
Monocyte-Macrophage-Serous Fluid: 67 % (ref 50–90)
NEUTROPHIL FLUID: 3 % (ref 0–25)
Total Nucleated Cell Count, Fluid: 303 cu mm (ref 0–1000)

## 2015-10-12 LAB — CBC
HCT: 36.7 % (ref 36.0–46.0)
HEMOGLOBIN: 12.5 g/dL (ref 12.0–15.0)
MCH: 27.2 pg (ref 26.0–34.0)
MCHC: 34.1 g/dL (ref 30.0–36.0)
MCV: 79.8 fL (ref 78.0–100.0)
Platelets: 255 10*3/uL (ref 150–400)
RBC: 4.6 MIL/uL (ref 3.87–5.11)
RDW: 14.3 % (ref 11.5–15.5)
WBC: 4.8 10*3/uL (ref 4.0–10.5)

## 2015-10-12 LAB — BASIC METABOLIC PANEL
Anion gap: 6 (ref 5–15)
BUN: 34 mg/dL — AB (ref 6–20)
CHLORIDE: 105 mmol/L (ref 101–111)
CO2: 25 mmol/L (ref 22–32)
CREATININE: 1.41 mg/dL — AB (ref 0.44–1.00)
Calcium: 8 mg/dL — ABNORMAL LOW (ref 8.9–10.3)
GFR calc Af Amer: >60 mL/min (ref >60)
GFR calc non Af Amer: 52 mL/min — ABNORMAL LOW (ref >60)
Glucose, Bld: 95 mg/dL (ref 65–99)
Potassium: 4.1 mmol/L (ref 3.5–5.1)
SODIUM: 136 mmol/L (ref 135–145)

## 2015-10-12 LAB — RAPID HIV SCREEN (HIV 1/2 AB+AG)
HIV 1/2 Antibodies: NONREACTIVE
HIV-1 P24 Antigen - HIV24: NONREACTIVE

## 2015-10-12 LAB — RPR: RPR: NONREACTIVE

## 2015-10-12 LAB — GLUCOSE, SEROUS FLUID: GLUCOSE FL: 97 mg/dL

## 2015-10-12 LAB — PROTEIN, BODY FLUID: Total protein, fluid: <0.2 g/dL

## 2015-10-12 LAB — PREALBUMIN: PREALBUMIN: 12.8 mg/dL — AB (ref 18–38)

## 2015-10-12 LAB — LACTATE DEHYDROGENASE, PLEURAL OR PERITONEAL FLUID: LD, Fluid: 350 U/L — ABNORMAL HIGH (ref 3–23)

## 2015-10-12 LAB — ALBUMIN, FLUID (OTHER): ALBUMIN FL: 1.1 g/dL

## 2015-10-12 MED ORDER — LIDOCAINE HCL 1 % IJ SOLN
INTRAMUSCULAR | Status: AC
  Filled 2015-10-12: qty 20

## 2015-10-12 MED ORDER — FUROSEMIDE 10 MG/ML IJ SOLN
20.0000 mg | Freq: Two times a day (BID) | INTRAMUSCULAR | Status: DC
  Administered 2015-10-12 – 2015-10-13 (×3): 20 mg via INTRAVENOUS
  Filled 2015-10-12 (×3): qty 2

## 2015-10-12 MED ORDER — HEPARIN SODIUM (PORCINE) 5000 UNIT/ML IJ SOLN
5000.0000 [IU] | Freq: Three times a day (TID) | INTRAMUSCULAR | Status: DC
  Administered 2015-10-12 – 2015-10-17 (×10): 5000 [IU] via SUBCUTANEOUS
  Filled 2015-10-12 (×11): qty 1

## 2015-10-12 MED ORDER — ALBUMIN HUMAN 25 % IV SOLN
25.0000 g | Freq: Once | INTRAVENOUS | Status: AC
  Administered 2015-10-12: 25 g via INTRAVENOUS
  Filled 2015-10-12: qty 100

## 2015-10-12 NOTE — Procedures (Signed)
Ultrasound-guided diagnostic and therapeutic paracentesis performed yielding 2.2 liters of light serosanguineous colored fluid. No immediate complications. Caroline Elliott E 10:55 AM 10/12/2015

## 2015-10-12 NOTE — Progress Notes (Signed)
[facility] Progress Note  Patient: Caroline Elliott JXB:[id number]   PCP: Cain SaupeFULP, CAMMIE, MD DOB: [birth year]   DOA: 10/11/2015   DOS: 10/12/2015   Date of Service: the patient was seen and examined on 10/12/2015  Subjective: Continues to complain of abdominal pain in the epigastric region controlled with only pain medication. No nausea no vomiting. Has not been passing gas did not have any BM. No burning urination. No fever no chills Nutrition: He remained nothing by mouth  Brief hospital course: Pt. with PMH of GERD; admitted on 10/11/2015, with complaint of epigastric pain, was found to have enterocolitis as well as ascites. Patient underwent paracentesis and the findings were consistent with possible nephrotic syndrome, nephrology was consulted. Currently further plan is obtain 24-hour urinary protein and further workup pertaining to ascites..  Assessment and Plan: 1. Ascites  Acute kidney injury Hypoalbuminemia  Patient presented with epigastric abdominal pain. CT of the abdomen was positive for ascites as well as small bowel and ascending colon thickening. Patient underwent paracentesis and the fluid ratio was consistent with nephrotic syndrome. With history of proteinuria as well as hypoalbuminemia as well as acute kidney injury nephrology was consulted for further workup. Plan is to obtain 24-hour protein excretion. Nephrology recommending to stop IV fluids and give trial of IV Lasix 20 mg twice a day. I would give 25 g of IV albumin 1. Monitor renal function.  2. Suspected enterocolitis. CT scan was positive for small bowel wall thickening. Gastroenterology was consulted who feels that patient does not have any evidence of infection ongoing and therefore recommended to stop the antibiotic. Appreciate their input and appreciated effort in consulting nephrology. If renal workup is negative patient may require colonoscopy.  3. GERD. Continuing IV Pepcid.  Pain management: When  necessary Tylenol, morphine Activity: No indication for physical therapy Bowel regimen: last BM 10/11/2015 Diet: Clear liquid diet DVT Prophylaxis: subcutaneous Heparin  Advance goals of care discussion: Full code  Family Communication: family was present at bedside, at the time of interview. The pt provided permission to discuss medical plan with the family. Opportunity was given to ask question and all questions were answered satisfactorily.   Disposition:  Discharge to home. Expected discharge date: 10/15/2015, workup for ascites  Consultants: Gastroenterology, nephrology Procedures: Ultrasound-guided paracentesis  Antibiotics: Anti-infectives    Start     Dose/Rate Route Frequency Ordered Stop   10/11/15 2200  metroNIDAZOLE (FLAGYL) IVPB 500 mg  Status:  Discontinued     500 mg 100 mL/hr over 60 Minutes Intravenous Every 8 hours 10/11/15 2118 10/12/15 1547   10/11/15 2200  ciprofloxacin (CIPRO) IVPB 400 mg  Status:  Discontinued     400 mg 200 mL/hr over 60 Minutes Intravenous Every 12 hours 10/11/15 2118 10/12/15 1547        Intake/Output Summary (Last 24 hours) at 10/12/15 1954 Last data filed at 10/12/15 1824  Gross per 24 hour  Intake 3043.34 ml  Output    300 ml  Net 2743.34 ml   Filed Weights   10/11/15 2013  Weight: 71.668 kg (158 lb)    Objective: Physical Exam: Filed Vitals:   10/12/15 1035 10/12/15 1052 10/12/15 1503 10/12/15 1823  BP: 135/110 138/101 144/107 130/100  Pulse:  100 98 111  Temp:  99 F (37.2 C) 97.6 F (36.4 C) 98 F (36.7 C)  TempSrc:  Oral Axillary Oral  Resp:  18 17 19   Height:      Weight:      SpO2:  100% 100% 100%    General: Alert, Awake and Oriented to Time, Place and Person. Appear in mild distress Eyes: PERRL, Conjunctiva normal ENT: Oral Mucosa clear moist. Neck: no JVD, no Abnormal Mass Or lumps Cardiovascular: S1 and S2 Present, no Murmur, Respiratory: Bilateral Air entry equal and Decreased, Clear to  Auscultation, no Crackles, no wheezes Abdomen: Bowel Sound present, Soft and mild tenderness Skin: no redness, no Rash  Extremities: no Pedal edema, no calf tenderness Neurologic: Grossly no focal neuro deficit. Bilaterally Equal motor strength  Data Reviewed: CBC:  Recent Labs Lab 10/09/15 0740 10/11/15 2121 10/12/15 0541  WBC 3.6* 5.0 4.8  NEUTROABS 2.5 3.7  --   HGB 10.4* 13.5 12.5  HCT 30.5* 40.1 36.7  MCV 79.8 80.2 79.8  PLT 210 208 255   Basic Metabolic Panel:  Recent Labs Lab 10/09/15 0740 10/11/15 2121 10/12/15 0541  NA 138 137 136  K 3.4* 4.1 4.1  CL 108 104 105  CO2 25 27 25   GLUCOSE 91 103* 95  BUN 12 34* 34*  CREATININE 0.94 1.51* 1.41*  CALCIUM 7.7* 8.4* 8.0*  MG  --  1.8  --     Liver Function Tests:  Recent Labs Lab 10/09/15 0740 10/11/15 2121  AST 18 25  ALT 10* 9*  ALKPHOS 48 51  BILITOT 0.5 0.4  PROT 5.4* 5.6*  ALBUMIN 2.0* 2.0*    Recent Labs Lab 10/09/15 0740  LIPASE 20   No results for input(s): AMMONIA in the last 168 hours. Coagulation Profile: No results for input(s): INR, PROTIME in the last 168 hours. Cardiac Enzymes: No results for input(s): CKTOTAL, CKMB, CKMBINDEX, TROPONINI in the last 168 hours. BNP (last 3 results) No results for input(s): PROBNP in the last 8760 hours.  CBG: No results for input(s): GLUCAP in the last 168 hours.  Studies: [place] Paracentesis  10/12/2015  INDICATION: Several months of epigastric abdominal pain with recent CT scan revealing a a moderate amount of abdominal ascites. Patient was referred to the hospital for admission and request made for diagnostic and therapeutic paracentesis. EXAM: ULTRASOUND GUIDED DIAGNOSTIC AND THERAPEUTIC PARACENTESIS MEDICATIONS: 1% lidocaine COMPLICATIONS: None immediate. PROCEDURE: Informed written consent was obtained from the patient after a discussion of the risks, benefits and alternatives to treatment. A timeout was performed prior to the initiation of the  procedure. Initial ultrasound scanning demonstrates a moderate amount of ascites within the right lower abdominal quadrant. The right lower abdomen was prepped and draped in the usual sterile fashion. 1% lidocaine was used for local anesthesia. Following this, a 19 gauge, 7-cm, Yueh catheter was introduced. An ultrasound image was saved for documentation purposes. The paracentesis was performed. The catheter was removed and a dressing was applied. The patient tolerated the procedure well without immediate post procedural complication. FINDINGS: A total of approximately 2.2 L of light serosanguineous fluid was removed. Samples were sent to the laboratory as requested by the clinical team. IMPRESSION: Successful ultrasound-guided paracentesis yielding 2.2 liters of peritoneal fluid. Read by: Barnetta ChapelKelly Osborne, PA-C Electronically Signed   By: Jolaine ClickArthur  Hoss M.D.   On: 10/12/2015 11:10     Scheduled Meds: . famotidine (PEPCID) IV  20 mg Intravenous Q12H  . furosemide  20 mg Intravenous BID  . heparin subcutaneous  5,000 Units Subcutaneous Q8H  . lidocaine       Continuous Infusions:  PRN Meds: morphine injection, prochlorperazine  Time spent: 30 minutes  Author: Lynden OxfordPranav Jaileen Janelle, MD [facility] Pager: [phone] 10/12/2015 7:54 PM  If 7PM-7AM, please contact night-coverage at [email or web address], password [facility]

## 2015-10-12 NOTE — Consult Note (Signed)
Charleston PootMichelle N Sanden Admit Date: 10/11/2015 10/12/2015 Arita MissSANFORD, Maurine Mowbray B Requesting Physician:  Elnoria HowardHung MD  Reason for Consult:  Proteinuria, Ascites HPI:  42F seen at the request of Dr. Elnoria HowardHung for the evaluation of proteinuria. Patient with minimal past medical history. Starting in January of this year she has had flares of abdominal pain with nausea and vomiting. During this time she uses some naproxen but not daily. She had several trips to the emergency room for flares of these symptoms and most recently a CT of the abdomen and pelvis with IV contrast demonstrated thickening of the small bowel and ascending colon with a large amount of ascites. With concern for infectious enteritis/colitis she was placed on antibiotics and admitted. Her ascites (2.2L) was drained with a low serum to ascites albumin gradient.  Her creatinine was 0.93 days ago and is currently 1.4. Urinalysis on 7/4 had 4+ protein with moderate hemoglobin and 0-5 red blood cells per high-powered field. A urine analysis in 2009 had 2+ protein.  She has no family history of renal insufficiency, dialysis, transplantation. She denies any identified frothy urine, tea-colored urine, grossly bloody urine. She has not noticed any swelling of her face hands or feet. Weight has been stable. No sores of the mouth or nose, joint pains, unexplained rashes, unexplained bruising or petechia.  She is currently on normal saline at 100 mL per hour. She is eating and drinking without difficulty.    CREATININE, SER  Date Value  10/12/2015 1.41 mg/dL*  16/10/960407/09/2015 5.401.51 mg/dL*  98/11/914707/07/2015 8.290.94 mg/dL  56/21/308604/16/2009 5.780.96  ] I/Os:  ROS Balance of 12 systems is negative w/ exceptions as above  PMH  Past Medical History  Diagnosis Date  . Seasonal allergies   . Ascites 10/11/2015 hospitalized   PSH  Past Surgical History  Procedure Laterality Date  . No past surgeries     FH  Family History  Problem Relation Age of Onset  . Hypertension Father    SH   reports that she has never smoked. She has never used smokeless tobacco. She reports that she drinks alcohol. She reports that she does not use illicit drugs. Allergies  Allergies  Allergen Reactions  . Penicillins Hives and Other (See Comments)    Has patient had a PCN reaction causing immediate rash, facial/tongue/throat swelling, SOB or lightheadedness with hypotension: No Has patient had a PCN reaction causing severe rash involving mucus membranes or skin necrosis: No Has patient had a PCN reaction that required hospitalization Yes Has patient had a PCN reaction occurring within the last 10 years: No If all of the above answers are "NO", then may proceed with Cephalosporin use.  Marland Kitchen. Omeprazole Magnesium Rash    All over body. Pt. followed up with doctor post reaction.   Home medications Prior to Admission medications   Medication Sig Start Date End Date Taking? Authorizing Provider  cetirizine (ZYRTEC) 10 MG tablet Take 10 mg by mouth daily as needed for allergies.    Yes Historical Provider, MD  ondansetron (ZOFRAN ODT) 4 MG disintegrating tablet Take 1 tablet (4 mg total) by mouth every 8 (eight) hours as needed for nausea or vomiting. 10/09/15   Samantha Tripp Dowless, PA-C  ranitidine (ZANTAC) 150 MG tablet Take 1 tablet (150 mg total) by mouth 2 (two) times daily. 10/09/15   Samantha Tripp Dowless, PA-C  sucralfate (CARAFATE) 1 GM/10ML suspension Take 10 mLs (1 g total) by mouth 4 (four) times daily -  with meals and at bedtime. 10/09/15  Samantha Tripp Dowless, PA-C    Current Medications Scheduled Meds: . albumin human  25 g Intravenous Once  . ciprofloxacin  400 mg Intravenous Q12H  . famotidine (PEPCID) IV  20 mg Intravenous Q12H  . lidocaine      . metronidazole  500 mg Intravenous Q8H   Continuous Infusions: . sodium chloride 100 mL/hr at 10/12/15 1053   PRN Meds:.morphine injection, prochlorperazine  CBC  Recent Labs Lab 10/09/15 0740 10/11/15 2121 10/12/15 0541   WBC 3.6* 5.0 4.8  NEUTROABS 2.5 3.7  --   HGB 10.4* 13.5 12.5  HCT 30.5* 40.1 36.7  MCV 79.8 80.2 79.8  PLT 210 208 255   Basic Metabolic Panel  Recent Labs Lab 10/09/15 0740 10/11/15 2121 10/12/15 0541  NA 138 137 136  K 3.4* 4.1 4.1  CL 108 104 105  CO2 25 27 25   GLUCOSE 91 103* 95  BUN 12 34* 34*  CREATININE 0.94 1.51* 1.41*  CALCIUM 7.7* 8.4* 8.0*    Physical Exam  Blood pressure 144/107, pulse 98, temperature 97.6 F (36.4 C), temperature source Axillary, resp. rate 17, height 5\' 4"  (1.626 m), weight 71.668 kg (158 lb), last menstrual period 09/30/2015, SpO2 100 %. GEN: NAD ENT: NCAT EYES: EOMI CV: RRR PULM: CTAB ABD: s/nt/nd SKIN: no rashes/lesions EXT: Trace LEE b/l   Assessment 36F with N/V, abd pain found to have ascites and thickened SB/ascending colon and 4+ protein on UA. Has mild AKI as well.    1. Proteinuria 2. Hypoalbuminemia 3. Ascites, low SAAG 4. Bowel Wall thickening 5. N/V, Abd Pain 6. Mild AKI  Plan 1. Need to quantify protein excretion with UP/C 2. If near or fully nephrotic would do serological workup 3. Stop IVFs 4. Trial of lasix 20mg  IV BID  Sabra Heckyan Tavi Hoogendoorn MD [phone] pgr 10/12/2015, 3:46 PM

## 2015-10-12 NOTE — Progress Notes (Signed)
Initial Nutrition Assessment  DOCUMENTATION CODES:   Not applicable  INTERVENTION:   -RD will follow for diet advancement and supplement as appropriate  NUTRITION DIAGNOSIS:   Inadequate oral intake related to altered GI function as evidenced by NPO status.  GOAL:   Patient will meet greater than or equal to 90% of their needs  MONITOR:   PO intake, Supplement acceptance, Diet advancement, Labs, Weight trends, Skin, I & O's  REASON FOR ASSESSMENT:   Malnutrition Screening Tool    ASSESSMENT:   Caroline Elliott is a 25 y.o. female with medical history significant of GERD and allergies; who presents with complaints of abdominal pain with nausea and vomiting. Symptoms have been intermittent and initially started in 04/2015. Abdominal   Pt admitted with epigastric abdominal pain/ enterocolitis.   Pt underwent paracentesis this AM; noted 2.2 L fluid yielded from procedure.   Spoke with pt at bedside. She reports poor appetite and weight loss over the past 6 months. She reports she initially had a decreased appetite, which initially improved around March, but then pt started vomiting with most PO intake. Pt reports PTA she was consuming mostly jello and full liquids, but would vomit all solid foods. Pt is currently NPO, but noted her consuming ice chips without difficulty.  Pt estimates UBW of around 190# and has experienced a 30# wt loss over the past 6 months. Wt hx reviewed. Noted pt has experienced a 7.1% wt loss over the past 6 weeks.   Nutrition-Focused physical exam completed. Findings are no fat depletion, no muscle depletion, and no edema.   Pt understanding of rationale for NPO order, but is amenable to nutritional supplements with diet advancement. Pt would prefer Ensure.   Labs reviewed.   Diet Order:  Diet NPO time specified  Skin:  Reviewed, no issues  Last BM:  10/11/15  Height:   Ht Readings from Last 1 Encounters:  10/11/15 5\' 4"  (1.626 m)    Weight:    Wt Readings from Last 1 Encounters:  10/11/15 158 lb (71.668 kg)    Ideal Body Weight:  54.5 kg  BMI:  Body mass index is 27.11 kg/(m^2).  Estimated Nutritional Needs:   Kcal:  1500-1700  Protein:  75-90 grams  Fluid:  1.5-1.7 L  EDUCATION NEEDS:   No education needs identified at this time  Caroline Elliott, RD, LDN, CDE Pager: [phone] After hours Pager: [phone]

## 2015-10-12 NOTE — Consult Note (Signed)
Reason for Consult: ABM pain, nausea, vomiting, and weight loss Referring Physician: [facility]  Charleston Poot HPI: This is a 25 year old female with a PMH of GERD and seasonal allergies admitted for complaints of nausea, vomiting, abdominal pain, and an abnormal CT scan.  In the office, Dr. Loreta Ave ordered an abdominal CT scan and it revealed small bowel wall thickening in the mid abdomen and aslo in the ascending colon.  There was also a significant amount of ascites.  Further evaluation in the ER revealed hypoalbuminemia, >300 protein in the urine, + HGB on the urine dipstick, an elevated creatinine at 1.5.  Her creatinine on 10/09/2015 was at 0.9.  She denies any issues with hematochezia, melena, diarrhea, or constipation.  She is s/p 2.2 liters of ascites removed with a paracentesis and it was noted to be serosanguinous.  Over the past 7 months she denies seeing any issues with foamy urine and recently, as a result of her nausea and vomiting, she was not able to urine secondary to dehydration.  She visited the [place] for one week earlier in the year, but no overt evidence of infections.  Currently she is feeling better with the pain medications.  Past Medical History  Diagnosis Date  . Seasonal allergies   . Ascites 10/11/2015 hospitalized    Past Surgical History  Procedure Laterality Date  . No past surgeries      Family History  Problem Relation Age of Onset  . Hypertension Father     Social History:  reports that she has never smoked. She has never used smokeless tobacco. She reports that she drinks alcohol. She reports that she does not use illicit drugs.  Allergies:  Allergies  Allergen Reactions  . Penicillins Hives and Other (See Comments)    Has patient had a PCN reaction causing immediate rash, facial/tongue/throat swelling, SOB or lightheadedness with hypotension: No Has patient had a PCN reaction causing severe rash involving mucus membranes or skin necrosis: No Has  patient had a PCN reaction that required hospitalization Yes Has patient had a PCN reaction occurring within the last 10 years: No If all of the above answers are "NO", then may proceed with Cephalosporin use.  Marland Kitchen Omeprazole Magnesium Rash    All over body. Pt. followed up with doctor post reaction.    Medications:  Scheduled: . albumin human  25 g Intravenous Once  . ciprofloxacin  400 mg Intravenous Q12H  . famotidine (PEPCID) IV  20 mg Intravenous Q12H  . feeding supplement  1 Container Oral TID BM  . feeding supplement (ENSURE ENLIVE)  237 mL Oral BID BM  . lidocaine      . metronidazole  500 mg Intravenous Q8H   Continuous: . sodium chloride 100 mL/hr at 10/12/15 1053    Results for orders placed or performed during the hospital encounter of 10/11/15 (from the past 24 hour(s))  Magnesium     Status: None   Collection Time: 10/11/15  9:21 PM  Result Value Ref Range   Magnesium 1.8 1.7 - 2.4 mg/dL  Comprehensive metabolic panel     Status: Abnormal   Collection Time: 10/11/15  9:21 PM  Result Value Ref Range   Sodium 137 135 - 145 mmol/L   Potassium 4.1 3.5 - 5.1 mmol/L   Chloride 104 101 - 111 mmol/L   CO2 27 22 - 32 mmol/L   Glucose, Bld 103 (H) 65 - 99 mg/dL   BUN 34 (H) 6 - 20 mg/dL  Creatinine, Ser 1.51 (H) 0.44 - 1.00 mg/dL   Calcium 8.4 (L) 8.9 - 10.3 mg/dL   Total Protein 5.6 (L) 6.5 - 8.1 g/dL   Albumin 2.0 (L) 3.5 - 5.0 g/dL   AST 25 15 - 41 U/L   ALT 9 (L) 14 - 54 U/L   Alkaline Phosphatase 51 38 - 126 U/L   Total Bilirubin 0.4 0.3 - 1.2 mg/dL   GFR calc non Af Amer 48 (L) >60 mL/min   GFR calc Af Amer 55 (L) >60 mL/min   Anion gap 6 5 - 15  CBC WITH DIFFERENTIAL     Status: None   Collection Time: 10/11/15  9:21 PM  Result Value Ref Range   WBC 5.0 4.0 - 10.5 K/uL   RBC 5.00 3.87 - 5.11 MIL/uL   Hemoglobin 13.5 12.0 - 15.0 g/dL   HCT 69.640.1 29.536.0 - 28.446.0 %   MCV 80.2 78.0 - 100.0 fL   MCH 27.0 26.0 - 34.0 pg   MCHC 33.7 30.0 - 36.0 g/dL   RDW 13.214.2  44.011.5 - 10.215.5 %   Platelets 208 150 - 400 K/uL   Neutrophils Relative % 74 %   Neutro Abs 3.7 1.7 - 7.7 K/uL   Lymphocytes Relative 17 %   Lymphs Abs 0.9 0.7 - 4.0 K/uL   Monocytes Relative 7 %   Monocytes Absolute 0.4 0.1 - 1.0 K/uL   Eosinophils Relative 0 %   Eosinophils Absolute 0.0 0.0 - 0.7 K/uL   Basophils Relative 1 %   Basophils Absolute 0.0 0.0 - 0.1 K/uL  TSH     Status: None   Collection Time: 10/11/15  9:21 PM  Result Value Ref Range   TSH 2.920 0.350 - 4.500 uIU/mL  Sedimentation rate     Status: Abnormal   Collection Time: 10/11/15  9:21 PM  Result Value Ref Range   Sed Rate 27 (H) 0 - 22 mm/hr  C-reactive protein     Status: None   Collection Time: 10/11/15  9:21 PM  Result Value Ref Range   CRP <0.5 <1.0 mg/dL  Urine rapid drug screen (hosp performed)     Status: Abnormal   Collection Time: 10/12/15  2:55 AM  Result Value Ref Range   Opiates POSITIVE (A) NONE DETECTED   Cocaine NONE DETECTED NONE DETECTED   Benzodiazepines NONE DETECTED NONE DETECTED   Amphetamines NONE DETECTED NONE DETECTED   Tetrahydrocannabinol NONE DETECTED NONE DETECTED   Barbiturates NONE DETECTED NONE DETECTED  CBC     Status: None   Collection Time: 10/12/15  5:41 AM  Result Value Ref Range   WBC 4.8 4.0 - 10.5 K/uL   RBC 4.60 3.87 - 5.11 MIL/uL   Hemoglobin 12.5 12.0 - 15.0 g/dL   HCT 72.536.7 36.636.0 - 44.046.0 %   MCV 79.8 78.0 - 100.0 fL   MCH 27.2 26.0 - 34.0 pg   MCHC 34.1 30.0 - 36.0 g/dL   RDW 34.714.3 42.511.5 - 95.615.5 %   Platelets 255 150 - 400 K/uL  Basic metabolic panel     Status: Abnormal   Collection Time: 10/12/15  5:41 AM  Result Value Ref Range   Sodium 136 135 - 145 mmol/L   Potassium 4.1 3.5 - 5.1 mmol/L   Chloride 105 101 - 111 mmol/L   CO2 25 22 - 32 mmol/L   Glucose, Bld 95 65 - 99 mg/dL   BUN 34 (H) 6 - 20 mg/dL   Creatinine, Ser 3.871.41 (  H) 0.44 - 1.00 mg/dL   Calcium 8.0 (L) 8.9 - 10.3 mg/dL   GFR calc non Af Amer 52 (L) >60 mL/min   GFR calc Af Amer >60 >60 mL/min    Anion gap 6 5 - 15  Prealbumin     Status: Abnormal   Collection Time: 10/12/15  5:41 AM  Result Value Ref Range   Prealbumin 12.8 (L) 18 - 38 mg/dL  Rapid HIV screen (HIV 1/2 Ab+Ag)     Status: None   Collection Time: 10/12/15  5:41 AM  Result Value Ref Range   HIV-1 P24 Antigen - HIV24 NON REACTIVE NON REACTIVE   HIV 1/2 Antibodies NON REACTIVE NON REACTIVE   Interpretation (HIV Ag Ab)      A non reactive test result means that HIV 1 or HIV 2 antibodies and HIV 1 p24 antigen were not detected in the specimen.     Ct Abdomen Pelvis W Contrast  10/11/2015  ADDENDUM REPORT: 10/11/2015 18:11 ADDENDUM: These results were called by telephone at the time of interpretation on 10/11/2015 at 5:50 pm to Dr. Charna Elizabeth , who verbally acknowledged these results. Although the the patient's young age would favor a benign etiology for the findings, it should be noted that the degree of abdominopelvic ascites is unusual for infectious/inflammatory enteritis. However, pertinent negatives on this study include normal bilateral ovaries, visualized appendix, and pancreas. Consider upper endoscopy for further evaluation, as clinically warranted. Electronically Signed   By: Charline Bills M.D.   On: 10/11/2015 18:11  10/11/2015  CLINICAL DATA:  Upper abdominal pain, nausea/vomiting EXAM: CT ABDOMEN AND PELVIS WITH CONTRAST TECHNIQUE: Multidetector CT imaging of the abdomen and pelvis was performed using the standard protocol following bolus administration of intravenous contrast. CONTRAST:  ISOVUE-300 IOPAMIDOL (ISOVUE-300) INJECTION 61% COMPARISON:  None. FINDINGS: Lower chest: 4 mm subpleural nodule in the left lower lobe (series 4/image 5), likely benign. No dedicated follow-up imaging is required given the patient's age. Hepatobiliary: Liver is within normal limits. Gallbladder is unremarkable. No intrahepatic or extrahepatic ductal dilatation. Pancreas: Within normal limits. Spleen: 2.7 cm loculated fluid  density lesion along the lateral spleen (series 3/ image 26), possibly reflecting a pseudocyst versus loculated ascites. Adrenals/Urinary Tract: Adrenal glands are within normal limits. Kidneys are within normal limits.  No hydronephrosis. Bladder is underdistended but unremarkable. Stomach/Bowel: Stomach is within normal limits. Wall thickening involving a long segments of small bowel in the mid abdomen (series 3/ images 39 and 49). Additional wall thickening involving ascending colon/cecum (coronal image 54). This appearance suggests infectious or inflammatory enterocolitis. Appendix is within normal limits (series 3/ image 64). Vascular/Lymphatic: No evidence of abdominal aortic aneurysm. No suspicious abdominopelvic lymphadenopathy. Reproductive: Uterus is within normal limits. Bilateral ovaries are within normal limits. Other: Large volume abdominopelvic ascites, mostly measuring simple fluid density, although possibly minimally complicated by hemorrhage in the dependent pelvis (series 3/image 78). Associated mild body wall edema. Musculoskeletal: Visualized osseous structures are within normal limits. IMPRESSION: Long segment small bowel wall thickening in the right mid abdomen. Additional wall thickening involving the ascending colon/cecum. This appearance suggests infectious or inflammatory enterocolitis. Large volume abdominopelvic ascites, possibly minimally complicated by hemorrhage. Additional ancillary findings as above. These results will be called to the ordering clinician or representative by the Radiology Department at the imaging location. Electronically Signed: By: Charline Bills M.D. On: 10/11/2015 17:33   US Paracentesis  10/12/2015  INDICATION: Several months of epigastric abdominal pain with recent CT scan revealing a  a moderate amount of abdominal ascites. Patient was referred to the hospital for admission and request made for diagnostic and therapeutic paracentesis. EXAM: ULTRASOUND  GUIDED DIAGNOSTIC AND THERAPEUTIC PARACENTESIS MEDICATIONS: 1% lidocaine COMPLICATIONS: None immediate. PROCEDURE: Informed written consent was obtained from the patient after a discussion of the risks, benefits and alternatives to treatment. A timeout was performed prior to the initiation of the procedure. Initial ultrasound scanning demonstrates a moderate amount of ascites within the right lower abdominal quadrant. The right lower abdomen was prepped and draped in the usual sterile fashion. 1% lidocaine was used for local anesthesia. Following this, a 19 gauge, 7-cm, Yueh catheter was introduced. An ultrasound image was saved for documentation purposes. The paracentesis was performed. The catheter was removed and a dressing was applied. The patient tolerated the procedure well without immediate post procedural complication. FINDINGS: A total of approximately 2.2 L of light serosanguineous fluid was removed. Samples were sent to the laboratory as requested by the clinical team. IMPRESSION: Successful ultrasound-guided paracentesis yielding 2.2 liters of peritoneal fluid. Read by: Barnetta ChapelKelly Osborne, PA-C Electronically Signed   By: Jolaine ClickArthur  Hoss M.D.   On: 10/12/2015 11:10   Ir [place] Guide Vasc Access Right  10/11/2015  INDICATION: 25 year old female with abdominal pain, and dehydration. She requires a CT scan of the abdomen and pelvis with intravenous contrast material button IV cannot be obtained secondary to dehydration. Ultrasound-guided IV start is requested. EXAM: IR ULTRASOUND GUIDANCE VASC ACCESS RIGHT MEDICATIONS: None ANESTHESIA/SEDATION: None FLUOROSCOPY TIME:  None COMPLICATIONS: None immediate. PROCEDURE: The right arm was sterilely prepped and draped in standard fashion using chlorhexidine skin prep. A tourniquet was applied. Using sterile technique and ultrasound guidance, the right brachial vein was punctured in the mid upper arm with a 21 gauge micropuncture needle. The needle was exchanged over a micro  wire for a 5 [place] transitional micro sheath. The sheath was advanced in the brachial vein. The sheath was fitted with a valves cap and flushed with sterile saline. The patient tolerated the procedure well. IMPRESSION: Successful ultrasound-guided IV start. Electronically Signed   By: Malachy MoanHeath  McCullough M.D.   On: 10/11/2015 17:17    ROS:  As stated above in the HPI otherwise negative.  Blood pressure 138/101, pulse 100, temperature 99 F (37.2 C), temperature source Oral, resp. rate 18, height 5\' 4"  (1.626 m), weight 71.668 kg (158 lb), last menstrual period 09/30/2015, SpO2 100 %.    PE: Gen: NAD, Alert and Oriented HEENT:  [place]/AT, EOMI Neck: Supple, no LAD Lungs: CTA Bilaterally CV: RRR without M/G/R ABM: Soft, NTND, +BS Ext: No C/C/E  Assessment/Plan: 1) Abnormal CT scan. 2) ABM pain. 3) Nausea/Vomiting. 4) Renal insufficiency. 5) Hypoalbuminemia. 6) Proteinuria.   The current SAAG (0.9) and total protein (<0.2) suggest a nephrotic syndrome.  This may be the source of the bowel wall thickening, i.e., it is a manifestation of edema.  She was emperically started on antibiotics for the possibility of an infectious etiology, which I think was fine.  THowever, I think it can be stopped at this time.  The next step is to ask Renal to weigh in on this issue.  Pending their recommendations, i.e., confirmation or refutation of a renal source, a colonoscopy may be required.  The small bowel thickening is too far down to reach with an enteroscope.  She denies any issues with diarrhea to suggest a protein-losing enteropathy.    Plan: 1) Renal consultation.  I called Renal. 2) 24 hour urine protein collection per  Renal. 3) Stop Cipro and Flagyl.  Harsh Trulock D 10/12/2015, 11:15 AM

## 2015-10-13 LAB — CBC WITH DIFFERENTIAL/PLATELET
BASOS PCT: 0 %
Basophils Absolute: 0 10*3/uL (ref 0.0–0.1)
EOS ABS: 0 10*3/uL (ref 0.0–0.7)
EOS PCT: 0 %
HCT: 36.2 % (ref 36.0–46.0)
HEMOGLOBIN: 12 g/dL (ref 12.0–15.0)
Lymphocytes Relative: 14 %
Lymphs Abs: 0.5 10*3/uL — ABNORMAL LOW (ref 0.7–4.0)
MCH: 26.7 pg (ref 26.0–34.0)
MCHC: 33.1 g/dL (ref 30.0–36.0)
MCV: 80.4 fL (ref 78.0–100.0)
Monocytes Absolute: 0.3 10*3/uL (ref 0.1–1.0)
Monocytes Relative: 9 %
NEUTROS PCT: 77 %
Neutro Abs: 2.9 10*3/uL (ref 1.7–7.7)
PLATELETS: 178 10*3/uL (ref 150–400)
RBC: 4.5 MIL/uL (ref 3.87–5.11)
RDW: 14 % (ref 11.5–15.5)
WBC: 3.8 10*3/uL — AB (ref 4.0–10.5)

## 2015-10-13 LAB — COMPREHENSIVE METABOLIC PANEL
ALK PHOS: 32 U/L — AB (ref 38–126)
ALT: 7 U/L — AB (ref 14–54)
AST: 16 U/L (ref 15–41)
Albumin: 2 g/dL — ABNORMAL LOW (ref 3.5–5.0)
Anion gap: 4 — ABNORMAL LOW (ref 5–15)
BILIRUBIN TOTAL: 0.5 mg/dL (ref 0.3–1.2)
BUN: 36 mg/dL — ABNORMAL HIGH (ref 6–20)
CALCIUM: 7.9 mg/dL — AB (ref 8.9–10.3)
CO2: 24 mmol/L (ref 22–32)
CREATININE: 1.55 mg/dL — AB (ref 0.44–1.00)
Chloride: 107 mmol/L (ref 101–111)
GFR, EST AFRICAN AMERICAN: 54 mL/min — AB (ref >60)
GFR, EST NON AFRICAN AMERICAN: 46 mL/min — AB (ref >60)
Glucose, Bld: 90 mg/dL (ref 65–99)
Potassium: 3.8 mmol/L (ref 3.5–5.1)
Sodium: 135 mmol/L (ref 135–145)
Total Protein: 4.2 g/dL — ABNORMAL LOW (ref 6.5–8.1)

## 2015-10-13 LAB — CALCIUM, IONIZED: CALCIUM, IONIZED, SERUM: 4.9 mg/dL (ref 4.5–5.6)

## 2015-10-13 LAB — MAGNESIUM: MAGNESIUM: 1.9 mg/dL (ref 1.7–2.4)

## 2015-10-13 LAB — ANTISTREPTOLYSIN O TITER: ASO: 128 IU/mL (ref 0.0–200.0)

## 2015-10-13 LAB — C4 COMPLEMENT: COMPLEMENT C4, BODY FLUID: 3 mg/dL — AB (ref 14–44)

## 2015-10-13 LAB — HEPATITIS C ANTIBODY: HCV Ab: <0.1 s/co ratio (ref 0.0–0.9)

## 2015-10-13 LAB — C3 COMPLEMENT: C3 COMPLEMENT: 22 mg/dL — AB (ref 82–167)

## 2015-10-13 LAB — PROTEIN / CREATININE RATIO, URINE
CREATININE, URINE: 178.42 mg/dL
PROTEIN CREATININE RATIO: 0.64 mg/mg{creat} — AB (ref 0.00–0.15)
TOTAL PROTEIN, URINE: 115 mg/dL

## 2015-10-13 LAB — HEPATITIS B SURFACE ANTIGEN: Hepatitis B Surface Ag: NEGATIVE

## 2015-10-13 MED ORDER — ONDANSETRON HCL 4 MG/2ML IJ SOLN
4.0000 mg | Freq: Four times a day (QID) | INTRAMUSCULAR | Status: DC | PRN
  Administered 2015-10-21 – 2015-10-22 (×2): 4 mg via INTRAVENOUS
  Filled 2015-10-13 (×3): qty 2

## 2015-10-13 MED ORDER — ACETAMINOPHEN 325 MG PO TABS
650.0000 mg | ORAL_TABLET | Freq: Four times a day (QID) | ORAL | Status: DC | PRN
  Administered 2015-10-14: 650 mg via ORAL
  Filled 2015-10-13: qty 2

## 2015-10-13 NOTE — Progress Notes (Signed)
[facility] Progress Note  Patient: Caroline Elliott:[id number]   PCP: Cain Saupe, MD DOB: April 09, 1990   DOA: 10/11/2015   DOS: 10/13/2015   Date of Service: the patient was seen and examined on 10/13/2015  Subjective: Nausea is resolved. Continues to complain about obstipation. Also complains about abdominal tightness. No fever no chills. Nutrition:   Brief hospital course: Pt. with PMH of GERD; admitted on 10/11/2015, with complaint of epigastric pain, was found to have enterocolitis as well as ascites. Patient underwent paracentesis and the findings were consistent with possible nephrotic syndrome, nephrology was consulted. Currently further plan is obtain 24-hour urinary protein and further workup pertaining to ascites..  Assessment and Plan: 1. Ascites  Acute kidney injury Hypoalbuminemia  Patient presented with epigastric abdominal pain. CT of the abdomen was positive for ascites as well as small bowel and ascending colon thickening. Patient underwent paracentesis and the fluid ratio was consistent with nephrotic syndrome. With history of proteinuria as well as hypoalbuminemia as well as acute kidney injury nephrology was consulted for further workup. Plan is to obtain 24-hour protein excretion. Nephrology recommending IV Lasix 20 mg twice a day. S/P 25 g of IV albumin 1. Renal function currently stable, Monitor renal function.  2. Suspected enterocolitis. Obstipation CT scan was positive for small bowel wall thickening. Gastroenterology was consulted who feels that patient does not have any evidence of infection ongoing and therefore recommended to stop the antibiotic. Appreciate their input and appreciated effort in consulting nephrology. If renal workup is negative patient may require colonoscopy. Recommended patient ambulated every 4 hours, if the patient continues to have obstipation I would get x-ray of the abdomen and transition her back to nothing by mouth. If The  patient starts passing gas, I will start the patient on MiraLAX.  3. GERD. Continuing IV Pepcid.  Pain management: When necessary Tylenol, morphine Activity: No indication for physical therapy Bowel regimen: last BM 10/11/2015 Diet: Clear liquid diet DVT Prophylaxis: subcutaneous Heparin  Advance goals of care discussion: Full code  Family Communication: family was present at bedside, at the time of interview. The pt provided permission to discuss medical plan with the family. Opportunity was given to ask question and all questions were answered satisfactorily.   Disposition:  Discharge to home. Expected discharge date: 10/15/2015, workup for ascites  Consultants: Gastroenterology, nephrology Procedures: Ultrasound-guided paracentesis  Antibiotics: Anti-infectives    Start     Dose/Rate Route Frequency Ordered Stop   10/11/15 2200  metroNIDAZOLE (FLAGYL) IVPB 500 mg  Status:  Discontinued     500 mg 100 mL/hr over 60 Minutes Intravenous Every 8 hours 10/11/15 2118 10/12/15 1547   10/11/15 2200  ciprofloxacin (CIPRO) IVPB 400 mg  Status:  Discontinued     400 mg 200 mL/hr over 60 Minutes Intravenous Every 12 hours 10/11/15 2118 10/12/15 1547        Intake/Output Summary (Last 24 hours) at 10/13/15 1131 Last data filed at 10/13/15 0900  Gross per 24 hour  Intake   1720 ml  Output      0 ml  Net   1720 ml   Filed Weights   10/11/15 2013  Weight: 71.668 kg (158 lb)    Objective: Physical Exam: Filed Vitals:   10/12/15 2206 10/13/15 0255 10/13/15 0558 10/13/15 0900  BP: 118/78 137/86 133/91 120/81  Pulse: 116 105 110 113  Temp: 98.9 F (37.2 C) 98.5 F (36.9 C) 98.3 F (36.8 C) 99.1 F (37.3 C)  TempSrc: Oral Oral  Axillary Oral  Resp:      Height:      Weight:      SpO2: 100% 100% 100% 100%    General: Alert, Awake and Oriented to Time, Place and Person. Appear in mild distress Eyes: PERRL, Conjunctiva normal ENT: Oral Mucosa clear moist. Neck: no JVD,  no Abnormal Mass Or lumps Cardiovascular: S1 and S2 Present, no Murmur, Respiratory: Bilateral Air entry equal and Decreased, Clear to Auscultation, no Crackles, no wheezes Abdomen: Bowel Sound Sluggish, Soft and mild tenderness Skin: no redness, no Rash  Extremities: no Pedal edema, no calf tenderness Neurologic: Grossly no focal neuro deficit. Bilaterally Equal motor strength  Data Reviewed: CBC:  Recent Labs Lab 10/09/15 0740 10/11/15 2121 10/12/15 0541 10/13/15 0545  WBC 3.6* 5.0 4.8 3.8*  NEUTROABS 2.5 3.7  --  2.9  HGB 10.4* 13.5 12.5 12.0  HCT 30.5* 40.1 36.7 36.2  MCV 79.8 80.2 79.8 80.4  PLT 210 208 255 178   Basic Metabolic Panel:  Recent Labs Lab 10/09/15 0740 10/11/15 2121 10/12/15 0541 10/13/15 0545  NA 138 137 136 135  K 3.4* 4.1 4.1 3.8  CL 108 104 105 107  CO2 25 27 25 24   GLUCOSE 91 103* 95 90  BUN 12 34* 34* 36*  CREATININE 0.94 1.51* 1.41* 1.55*  CALCIUM 7.7* 8.4* 8.0* 7.9*  MG  --  1.8  --  1.9    Liver Function Tests:  Recent Labs Lab 10/09/15 0740 10/11/15 2121 10/13/15 0545  AST 18 25 16   ALT 10* 9* 7*  ALKPHOS 48 51 32*  BILITOT 0.5 0.4 0.5  PROT 5.4* 5.6* 4.2*  ALBUMIN 2.0* 2.0* 2.0*    Recent Labs Lab 10/09/15 0740  LIPASE 20   No results for input(s): AMMONIA in the last 168 hours. Coagulation Profile: No results for input(s): INR, PROTIME in the last 168 hours. Cardiac Enzymes: No results for input(s): CKTOTAL, CKMB, CKMBINDEX, TROPONINI in the last 168 hours. BNP (last 3 results) No results for input(s): PROBNP in the last 8760 hours.  CBG: No results for input(s): GLUCAP in the last 168 hours.  Studies: No results found.   Scheduled Meds: . famotidine (PEPCID) IV  20 mg Intravenous Q12H  . furosemide  20 mg Intravenous BID  . heparin subcutaneous  5,000 Units Subcutaneous Q8H   Continuous Infusions:  PRN Meds: acetaminophen, morphine injection, ondansetron (ZOFRAN) IV  Time spent: 30  minutes  Author: Lynden OxfordPranav Patel, MD [facility] Pager: [phone] 10/13/2015 11:31 AM  If 7PM-7AM, please contact night-coverage at [email or web address], password [email or web address]

## 2015-10-13 NOTE — Progress Notes (Signed)
Admit: 10/11/2015 LOS: 1  73F with N/V, abd pain found to have ascites and thickened SB/ascending colon and 4+ protein on UA. Has mild AKI as well.  Subjective:  UP/C pending C3 and C4 very depressed ASO WNL Still having abd pain / discomfort  Reports good UOP with lasix, no recorded  07/07 0701 - 07/08 0700 In: 1480 [P.O.:30; I.V.:1050; IV Piggyback:400] Out: -   Filed Weights   10/11/15 2013  Weight: 71.668 kg (158 lb)    Scheduled Meds: . famotidine (PEPCID) IV  20 mg Intravenous Q12H  . furosemide  20 mg Intravenous BID  . heparin subcutaneous  5,000 Units Subcutaneous Q8H   Continuous Infusions:  PRN Meds:.acetaminophen, morphine injection, prochlorperazine  Current Labs: reviewed   RENAL STUDIES UP/C: pending C3 low at 22, C4 3 HBV neg HIV neg RPR neg   Physical Exam:  Blood pressure 133/91, pulse 110, temperature 98.3 F (36.8 C), temperature source Axillary, resp. rate 19, height 5\' 4"  (1.626 m), weight 71.668 kg (158 lb), last menstrual period 09/30/2015, SpO2 100 %. GEN: NAD ENT: NCAT EYES: EOMI CV: RRR PULM: CTAB ABD: s/nt/nd SKIN: no rashes/lesions EXT: Trace LEE b/l  A 1. Proteinuria 2. Hypocomplementemia 3. Hypoalbuminemia 4. Ascites, low SAAG 5. Bowel Wall thickening 6. N/V, Abd Pain 7. Mild AKI  Plan 1. Await UP/C 2. 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 Might need renal Bx 4. Cont lasix to help with ascites Daily weights, Daily Renal Panel, Strict I/Os, Avoid nephrotoxins (NSAIDs, judicious IV Contrast)    Sabra Heckyan Jihad Brownlow MD 10/13/2015, 9:46 AM   Recent Labs Lab 10/11/15 2121 10/12/15 0541 10/13/15 0545  NA 137 136 135  K 4.1 4.1 3.8  CL 104 105 107  CO2 27 25 24   GLUCOSE 103* 95 90  BUN 34* 34* 36*  CREATININE 1.51* 1.41* 1.55*  CALCIUM 8.4* 8.0* 7.9*    Recent Labs Lab 10/09/15 0740 10/11/15 2121 10/12/15 0541 10/13/15 0545  WBC 3.6* 5.0 4.8 3.8*  NEUTROABS 2.5 3.7  --  2.9  HGB 10.4* 13.5 12.5  12.0  HCT 30.5* 40.1 36.7 36.2  MCV 79.8 80.2 79.8 80.4  PLT 210 208 255 178

## 2015-10-14 ENCOUNTER — Inpatient Hospital Stay (HOSPITAL_COMMUNITY): Payer: Commercial Managed Care - HMO

## 2015-10-14 LAB — APTT: APTT: 26 s (ref 24–37)

## 2015-10-14 LAB — BASIC METABOLIC PANEL
ANION GAP: 5 (ref 5–15)
BUN: 43 mg/dL — ABNORMAL HIGH (ref 6–20)
CHLORIDE: 106 mmol/L (ref 101–111)
CO2: 23 mmol/L (ref 22–32)
Calcium: 7.6 mg/dL — ABNORMAL LOW (ref 8.9–10.3)
Creatinine, Ser: 2.2 mg/dL — ABNORMAL HIGH (ref 0.44–1.00)
GFR calc non Af Amer: 30 mL/min — ABNORMAL LOW (ref >60)
GFR, EST AFRICAN AMERICAN: 35 mL/min — AB (ref >60)
Glucose, Bld: 92 mg/dL (ref 65–99)
POTASSIUM: 3.8 mmol/L (ref 3.5–5.1)
Sodium: 134 mmol/L — ABNORMAL LOW (ref 135–145)

## 2015-10-14 LAB — TYPE AND SCREEN
ABO/RH(D): A POS
ANTIBODY SCREEN: NEGATIVE

## 2015-10-14 LAB — ABO/RH: ABO/RH(D): A POS

## 2015-10-14 LAB — PROTIME-INR
INR: 1.24 (ref 0.00–1.49)
Prothrombin Time: 15.8 seconds — ABNORMAL HIGH (ref 11.6–15.2)

## 2015-10-14 MED ORDER — KCL IN DEXTROSE-NACL 20-5-0.45 MEQ/L-%-% IV SOLN
INTRAVENOUS | Status: DC
  Administered 2015-10-14 – 2015-10-16 (×2): via INTRAVENOUS
  Filled 2015-10-14 (×3): qty 1000

## 2015-10-14 MED ORDER — ALUM & MAG HYDROXIDE-SIMETH 200-200-20 MG/5ML PO SUSP: 15.0000 mL | ORAL | Status: DC | PRN

## 2015-10-14 MED ORDER — ALBUMIN HUMAN 25 % IV SOLN
25.0000 g | Freq: Once | INTRAVENOUS | Status: AC
  Administered 2015-10-14: 25 g via INTRAVENOUS
  Filled 2015-10-14: qty 100

## 2015-10-14 MED ORDER — BISACODYL 10 MG RE SUPP
10.0000 mg | Freq: Once | RECTAL | Status: AC
  Administered 2015-10-14: 10 mg via RECTAL
  Filled 2015-10-14: qty 1

## 2015-10-14 NOTE — Progress Notes (Signed)
Admit: 10/11/2015 LOS: 2  2F with N/V, abd pain found to have ascites and thickened SB/ascending colon and 4+ protein on UA. Has mild AKI as well.  Subjective:  UP/C <1 ANA pending Still having abd pain / discomfort  SCR further up  07/08 0701 - 07/09 0700 In: 580 [P.O.:480; IV Piggyback:100] Out: 500 [Urine:500]  Filed Weights   10/11/15 2013 10/14/15 0627  Weight: 71.668 kg (158 lb) 72.485 kg (159 lb 12.8 oz)    Scheduled Meds: . famotidine (PEPCID) IV  20 mg Intravenous Q12H  . heparin subcutaneous  5,000 Units Subcutaneous Q8H   Continuous Infusions:  PRN Meds:.acetaminophen, morphine injection, ondansetron (ZOFRAN) IV  Current Labs: reviewed   RENAL STUDIES UP/C: 0.64 C3 low at 22, C4 3 HBV neg HIV neg RPR neg  ANA: pending   Physical Exam:  Blood pressure 101/54, pulse 116, temperature 98 F (36.7 C), temperature source Oral, resp. rate 17, height 5\' 4"  (1.626 m), weight 72.485 kg (159 lb 12.8 oz), last menstrual period 09/30/2015, SpO2 99 %. GEN: NAD ENT: NCAT EYES: EOMI CV: RRR PULM: CTAB ABD: s/nt/nd SKIN: no rashes/lesions EXT: Trace LEE b/l  A 1. Proteinuria, UP/C 0.6; not nephrotic; doesn't explain ascites/bowel wall thickening 2. Hypocomplementemia 3. Hypoalbuminemia 4. Ascites, low SAAG  5. Bowel Wall thickening 6. N/V, Abd Pain 7. Mild AKI likely 2/2 contrast + paracentesis/fluids shifts  Plan 1. ANA pending, Still concern for SLE just not necessarily renal involvement 2. Stop lasix 3. Will need GI to weigh in again; still with sig abd symptoms 4. Daily weights, Daily Renal Panel, Strict I/Os, Avoid nephrotoxins (NSAIDs, judicious IV Contrast)    Sabra Heckyan Azlee Monforte MD 10/14/2015, 9:22 AM   Recent Labs Lab 10/12/15 0541 10/13/15 0545 10/14/15 0440  NA 136 135 134*  K 4.1 3.8 3.8  CL 105 107 106  CO2 25 24 23   GLUCOSE 95 90 92  BUN 34* 36* 43*  CREATININE 1.41* 1.55* 2.20*  CALCIUM 8.0* 7.9* 7.6*    Recent Labs Lab  10/09/15 0740 10/11/15 2121 10/12/15 0541 10/13/15 0545  WBC 3.6* 5.0 4.8 3.8*  NEUTROABS 2.5 3.7  --  2.9  HGB 10.4* 13.5 12.5 12.0  HCT 30.5* 40.1 36.7 36.2  MCV 79.8 80.2 79.8 80.4  PLT 210 208 255 178

## 2015-10-14 NOTE — Progress Notes (Signed)
[facility] Progress Note  Patient: Caroline Elliott ZOX:[id number]   PCP: Cain Saupe, MD DOB: [birth year]   DOA: 10/11/2015   DOS: 10/14/2015   Date of Service: the patient was seen and examined on 10/14/2015  Subjective: no nausea continues to have constipation have been passing gas. Also has abdominal pain. Nutrition: minimal oral intake  Brief hospital course: Pt. with PMH of GERD; admitted on 10/11/2015, with complaint of epigastric pain, was found to have enterocolitis as well as ascites. Patient underwent paracentesis and the findings were consistent with possible nephrotic syndrome, nephrology was consulted. Currently further plan is follow GI Recommendation.  Assessment and Plan: 1. Ascites  Acute kidney injury Hypoalbuminemia  Patient presented with epigastric abdominal pain. CT of the abdomen was positive for ascites as well as small bowel and ascending colon thickening. Patient underwent paracentesis and the fluid ratio was consistent with nephrotic syndrome. With history of proteinuria as well as hypoalbuminemia as well as acute kidney injury nephrology was consulted for further workup. Pt was given IV Lasix 20 mg twice a day,  S/P 25 g of IV albumin 2. Renal function currently worsening.  No evidence of nephrotic syndrome. Possibility of autoimmune syndrome can not be rule out due to hypocomplementemia.   2. Suspected enterocolitis. Obstipation- now constipation CT scan was positive for small bowel wall thickening. Gastroenterology was consulted who feels that patient does not have any evidence of infection ongoing and therefore recommended to stop the antibiotic. Appreciate their input and appreciated effort in consulting nephrology. If renal workup is negative patient may require colonoscopy. Recommended patient ambulated every 4 hours. Repeat x ray on 07/09 shows dilated bowel loops and no obstruction. Also has bowel wall thickening similar to earlier  CT. Gastroenterology called again but not available, will try to call again on 10/15/2015. Attempt one time dulcolax suppository.  3. GERD. Continuing IV Pepcid.  Pain management: When necessary Tylenol, morphine Activity: No indication for physical therapy Bowel regimen: last BM 10/11/2015 Diet: Clear liquid diet DVT Prophylaxis: subcutaneous Heparin  Advance goals of care discussion: Full code  Family Communication: family was present at bedside, at the time of interview. The pt provided permission to discuss medical plan with the family. Opportunity was given to ask question and all questions were answered satisfactorily.   Disposition:  Discharge to home. Expected discharge date: 10/17/2015, workup as per gastroenterology  Consultants: Gastroenterology, nephrology Procedures: Ultrasound-guided paracentesis  Antibiotics: Anti-infectives    Start     Dose/Rate Route Frequency Ordered Stop   10/11/15 2200  metroNIDAZOLE (FLAGYL) IVPB 500 mg  Status:  Discontinued     500 mg 100 mL/hr over 60 Minutes Intravenous Every 8 hours 10/11/15 2118 10/12/15 1547   10/11/15 2200  ciprofloxacin (CIPRO) IVPB 400 mg  Status:  Discontinued     400 mg 200 mL/hr over 60 Minutes Intravenous Every 12 hours 10/11/15 2118 10/12/15 1547        Intake/Output Summary (Last 24 hours) at 10/14/15 1857 Last data filed at 10/14/15 1551  Gross per 24 hour  Intake    200 ml  Output    100 ml  Net    100 ml   [facility] Weights   10/11/15 2013 10/14/15 0627  Weight: 71.668 kg (158 lb) 72.485 kg (159 lb 12.8 oz)    Objective: Physical Exam: Filed Vitals:   10/13/15 1616 10/13/15 2154 10/14/15 0627 10/14/15 1445  BP: 92/61 104/69 101/54 115/69  Pulse: 112 102 116 111  Temp: 97.3 F (  36.3 C) 98.8 F (37.1 C) 98 F (36.7 C) 98 F (36.7 C)  TempSrc: Oral Oral Oral Oral  Resp:  16 17 17   Height:      Weight:   72.485 kg (159 lb 12.8 oz)   SpO2: 100% 100% 99% 100%    General: Alert, Awake  and Oriented to Time, Place and Person. Appear in mild distress Eyes: PERRL, Conjunctiva normal ENT: Oral Mucosa clear moist. Neck: no JVD, no Abnormal Mass Or lumps Cardiovascular: S1 and S2 Present, no Murmur, Respiratory: Bilateral Air entry equal and Decreased, Clear to Auscultation, no Crackles, no wheezes Abdomen: Bowel Sound Sluggish, Soft and mild tenderness Extremities: no Pedal edema, no calf tenderness  Data Reviewed: CBC:  Recent Labs Lab 10/09/15 0740 10/11/15 2121 10/12/15 0541 10/13/15 0545  WBC 3.6* 5.0 4.8 3.8*  NEUTROABS 2.5 3.7  --  2.9  HGB 10.4* 13.5 12.5 12.0  HCT 30.5* 40.1 36.7 36.2  MCV 79.8 80.2 79.8 80.4  PLT 210 208 255 178   Basic Metabolic Panel:  Recent Labs Lab 10/09/15 0740 10/11/15 2121 10/12/15 0541 10/13/15 0545 10/14/15 0440  NA 138 137 136 135 134*  K 3.4* 4.1 4.1 3.8 3.8  CL 108 104 105 107 106  CO2 25 27 25 24 23   GLUCOSE 91 103* 95 90 92  BUN 12 34* 34* 36* 43*  CREATININE 0.94 1.51* 1.41* 1.55* 2.20*  CALCIUM 7.7* 8.4* 8.0* 7.9* 7.6*  MG  --  1.8  --  1.9  --     Liver Function Tests:  Recent Labs Lab 10/09/15 0740 10/11/15 2121 10/13/15 0545  AST 18 25 16   ALT 10* 9* 7*  ALKPHOS 48 51 32*  BILITOT 0.5 0.4 0.5  PROT 5.4* 5.6* 4.2*  ALBUMIN 2.0* 2.0* 2.0*    Recent Labs Lab 10/09/15 0740  LIPASE 20   No results for input(s): AMMONIA in the last 168 hours. Coagulation Profile:  Recent Labs Lab 10/14/15 0440  INR 1.24   Cardiac Enzymes: No results for input(s): CKTOTAL, CKMB, CKMBINDEX, TROPONINI in the last 168 hours. BNP (last 3 results) No results for input(s): PROBNP in the last 8760 hours.  CBG: No results for input(s): GLUCAP in the last 168 hours.  Studies: Dg Abd 2 Views  10/14/2015  CLINICAL DATA:  Abdominal distention 4 days EXAM: ABDOMEN - 2 VIEW COMPARISON:  CT 10/11/2015 FINDINGS: No free air beneath hemidiaphragms. Dilated loops of small bowel up to 3.5 cm similar to comparison CT.  There is gas throughout the small bowel and colon including the rectum. No pathologic calcifications IMPRESSION: 1. No intraperitoneal free air or evidence of high-grade bowel obstruction. 2. Dilated loops of small bowel in the RIGHT abdomen similar to comparison CT consistent with small bowel edema. Electronically Signed   By: Genevive BiStewart  Edmunds M.D.   On: 10/14/2015 12:46     Scheduled Meds: . bisacodyl  10 mg Rectal Once  . famotidine (PEPCID) IV  20 mg Intravenous Q12H  . heparin subcutaneous  5,000 Units Subcutaneous Q8H   Continuous Infusions: . dextrose 5 % and 0.45 % NaCl with KCl 20 mEq/L     PRN Meds: acetaminophen, alum & mag hydroxide-simeth, morphine injection, ondansetron (ZOFRAN) IV  Time spent: 30 minutes  Author: Lynden OxfordPranav Kassidy Frankson, MD [facility] Pager: [phone] 10/14/2015 6:57 PM  If 7PM-7AM, please contact night-coverage at [email or web address], password [email or web address]

## 2015-10-15 ENCOUNTER — Inpatient Hospital Stay (HOSPITAL_COMMUNITY): Payer: Commercial Managed Care - HMO

## 2015-10-15 DIAGNOSIS — R140 Abdominal distension (gaseous): Secondary | ICD-10-CM | POA: Insufficient documentation

## 2015-10-15 LAB — CBC WITH DIFFERENTIAL/PLATELET
BASOS PCT: 0 %
Basophils Absolute: 0 10*3/uL (ref 0.0–0.1)
Basophils Absolute: 0 10*3/uL (ref 0.0–0.1)
Basophils Relative: 0 %
EOS PCT: 0 %
EOS PCT: 0 %
Eosinophils Absolute: 0 10*3/uL (ref 0.0–0.7)
Eosinophils Absolute: 0 10*3/uL (ref 0.0–0.7)
HCT: 34.8 % — ABNORMAL LOW (ref 36.0–46.0)
HEMATOCRIT: 43.3 % (ref 36.0–46.0)
HEMOGLOBIN: 11.9 g/dL — AB (ref 12.0–15.0)
HEMOGLOBIN: 14.5 g/dL (ref 12.0–15.0)
LYMPHS ABS: 0.7 10*3/uL (ref 0.7–4.0)
LYMPHS ABS: 0.6 10*3/uL — AB (ref 0.7–4.0)
LYMPHS PCT: 27 %
Lymphocytes Relative: 15 %
MCH: 27.4 pg (ref 26.0–34.0)
MCH: 26.9 pg (ref 26.0–34.0)
MCHC: 34.2 g/dL (ref 30.0–36.0)
MCHC: 33.5 g/dL (ref 30.0–36.0)
MCV: 80 fL (ref 78.0–100.0)
MCV: 80.3 fL (ref 78.0–100.0)
MONO ABS: 0.3 10*3/uL (ref 0.1–1.0)
MONOS PCT: 9 %
Monocytes Absolute: 0.2 10*3/uL (ref 0.1–1.0)
Monocytes Relative: 12 %
NEUTROS ABS: 1.6 10*3/uL — AB (ref 1.7–7.7)
NEUTROS ABS: 2.5 10*3/uL (ref 1.7–7.7)
NEUTROS PCT: 76 %
Neutrophils Relative %: 60 %
Platelets: 252 10*3/uL (ref 150–400)
Platelets: 250 10*3/uL (ref 150–400)
RBC: 4.35 MIL/uL (ref 3.87–5.11)
RBC: 5.39 MIL/uL — ABNORMAL HIGH (ref 3.87–5.11)
RDW: 14.3 % (ref 11.5–15.5)
RDW: 14.1 % (ref 11.5–15.5)
WBC: 2.6 10*3/uL — ABNORMAL LOW (ref 4.0–10.5)
WBC: 3.3 10*3/uL — ABNORMAL LOW (ref 4.0–10.5)

## 2015-10-15 LAB — COMPREHENSIVE METABOLIC PANEL
ALBUMIN: 2.2 g/dL — AB (ref 3.5–5.0)
ALT: 10 U/L — ABNORMAL LOW (ref 14–54)
AST: 19 U/L (ref 15–41)
Alkaline Phosphatase: 28 U/L — ABNORMAL LOW (ref 38–126)
Anion gap: 8 (ref 5–15)
BILIRUBIN TOTAL: 0.5 mg/dL (ref 0.3–1.2)
BUN: 53 mg/dL — AB (ref 6–20)
CHLORIDE: 105 mmol/L (ref 101–111)
CO2: 21 mmol/L — AB (ref 22–32)
Calcium: 7.8 mg/dL — ABNORMAL LOW (ref 8.9–10.3)
Creatinine, Ser: 2.75 mg/dL — ABNORMAL HIGH (ref 0.44–1.00)
GFR calc Af Amer: 27 mL/min — ABNORMAL LOW (ref >60)
GFR calc non Af Amer: 23 mL/min — ABNORMAL LOW (ref >60)
GLUCOSE: 96 mg/dL (ref 65–99)
POTASSIUM: 4 mmol/L (ref 3.5–5.1)
SODIUM: 134 mmol/L — AB (ref 135–145)
TOTAL PROTEIN: 4.5 g/dL — AB (ref 6.5–8.1)

## 2015-10-15 LAB — URINALYSIS, ROUTINE W REFLEX MICROSCOPIC
Glucose, UA: NEGATIVE mg/dL
KETONES UR: NEGATIVE mg/dL
NITRITE: NEGATIVE
PH: 5 (ref 5.0–8.0)
PROTEIN: 100 mg/dL — AB
Specific Gravity, Urine: 1.03 (ref 1.005–1.030)

## 2015-10-15 LAB — BASIC METABOLIC PANEL
Anion gap: 7 (ref 5–15)
BUN: 56 mg/dL — AB (ref 6–20)
CALCIUM: 7.8 mg/dL — AB (ref 8.9–10.3)
CO2: 21 mmol/L — ABNORMAL LOW (ref 22–32)
Chloride: 105 mmol/L (ref 101–111)
Creatinine, Ser: 2.86 mg/dL — ABNORMAL HIGH (ref 0.44–1.00)
GFR calc Af Amer: 26 mL/min — ABNORMAL LOW (ref >60)
GFR, EST NON AFRICAN AMERICAN: 22 mL/min — AB (ref >60)
GLUCOSE: 115 mg/dL — AB (ref 65–99)
Potassium: 4.9 mmol/L (ref 3.5–5.1)
Sodium: 133 mmol/L — ABNORMAL LOW (ref 135–145)

## 2015-10-15 LAB — LACTIC ACID, PLASMA
LACTIC ACID, VENOUS: 0.8 mmol/L (ref 0.5–1.9)
LACTIC ACID, VENOUS: 1.9 mmol/L (ref 0.5–1.9)

## 2015-10-15 LAB — GLUCOSE, CAPILLARY: GLUCOSE-CAPILLARY: 119 mg/dL — AB (ref 65–99)

## 2015-10-15 LAB — URINE MICROSCOPIC-ADD ON

## 2015-10-15 LAB — FANA STAINING PATTERNS

## 2015-10-15 LAB — PROTEIN / CREATININE RATIO, URINE
CREATININE, URINE: 470.8 mg/dL
PROTEIN CREATININE RATIO: 0.33 mg/mg{creat} — AB (ref 0.00–0.15)
TOTAL PROTEIN, URINE: 155 mg/dL

## 2015-10-15 LAB — HEPATITIS B E ANTIBODY: HEP B E AB: NEGATIVE

## 2015-10-15 LAB — ANTINUCLEAR ANTIBODIES, IFA: ANA Ab, IFA: POSITIVE — AB

## 2015-10-15 LAB — LIPASE, BLOOD: Lipase: 20 U/L (ref 11–51)

## 2015-10-15 MED ORDER — PEG 3350-KCL-NA BICARB-NACL 420 G PO SOLR
4000.0000 mL | Freq: Once | ORAL | Status: AC
  Administered 2015-10-15: 4000 mL via ORAL
  Filled 2015-10-15: qty 4000

## 2015-10-15 MED ORDER — ALBUMIN HUMAN 25 % IV SOLN
25.0000 g | Freq: Once | INTRAVENOUS | Status: AC
  Administered 2015-10-15: 25 g via INTRAVENOUS
  Filled 2015-10-15: qty 100

## 2015-10-15 NOTE — Progress Notes (Signed)
Hold SSE per Dr. Loreta AveMann

## 2015-10-15 NOTE — Progress Notes (Signed)
Staff from Xray returned pt to room, they state that she almost passed out downstairs. Denies nausea. C/O abd pain, cool to touch, clammy, CBG 119, vs stable, Dr. Allena KatzPatel notified.

## 2015-10-15 NOTE — Progress Notes (Signed)
[facility] Progress Note  Patient: Caroline Elliott UJW:[id number]   PCP: Cain Saupe, MD DOB: 1990/11/14   DOA: 10/11/2015   DOS: 10/15/2015   Date of Service: the patient was seen and examined on 10/15/2015  Subjective: Complains a lot of worsening abdominal distention. He did not have any bowel movement. And required suppository. No fever no chills. Nutrition: minimal oral intake  Brief hospital course: Pt. with PMH of GERD; admitted on 10/11/2015, with complaint of epigastric pain, was found to have enterocolitis as well as ascites. Patient underwent paracentesis and the findings were consistent with possible nephrotic syndrome, nephrology was consulted. Currently further plan is follow GI Recommendation.  Assessment and Plan: 1. Ascites  Acute kidney injury Hypoalbuminemia  Patient presented with epigastric abdominal pain. CT of the abdomen was positive for ascites as well as small bowel and ascending colon thickening. Patient underwent paracentesis and the fluid ratio was consistent with nephrotic syndrome. With history of proteinuria as well as hypoalbuminemia as well as acute kidney injury nephrology was consulted for further workup. Pt was given IV Lasix 20 mg twice a day, currently on IV fluids S/P 25 g of IV albumin 2. Give 1 Renal function currently worsening.  No evidence of nephrotic syndrome. Possibility of autoimmune syndrome can not be rule out due to hypocomplementemia.   2. Suspected enterocolitis. Obstipation- now constipation CT scan was positive for small bowel wall thickening. Gastroenterology was consulted who feels that patient does not have any evidence of infection ongoing and therefore recommended to stop the antibiotic. Recommended patient ambulated every 4 hours. Repeat x ray on 07/09 shows dilated bowel loops and no obstruction. Also has bowel wall thickening similar to earlier CT. Gastroenterology scheduling the patient for colonoscopy  tomorrow.  3. GERD. Continuing IV Pepcid.  Pain management: When necessary Tylenol, morphine Activity: No indication for physical therapy Bowel regimen: last BM 10/14/2015, after suppository Diet: Nothing by mouth DVT Prophylaxis: subcutaneous Heparin  Advance goals of care discussion: Full code  Family Communication: family was present at bedside, at the time of interview. The pt provided permission to discuss medical plan with the family. Opportunity was given to ask question and all questions were answered satisfactorily.   Disposition:  Discharge to home. Expected discharge date: 10/17/2015, workup as per gastroenterology  Consultants: Gastroenterology, nephrology Procedures: Ultrasound-guided paracentesis  Antibiotics: Anti-infectives    Start     Dose/Rate Route Frequency Ordered Stop   10/11/15 2200  metroNIDAZOLE (FLAGYL) IVPB 500 mg  Status:  Discontinued     500 mg 100 mL/hr over 60 Minutes Intravenous Every 8 hours 10/11/15 2118 10/12/15 1547   10/11/15 2200  ciprofloxacin (CIPRO) IVPB 400 mg  Status:  Discontinued     400 mg 200 mL/hr over 60 Minutes Intravenous Every 12 hours 10/11/15 2118 10/12/15 1547        Intake/Output Summary (Last 24 hours) at 10/15/15 1926 Last data filed at 10/15/15 1804  Gross per 24 hour  Intake 1753.33 ml  Output    350 ml  Net 1403.33 ml   Filed Weights   10/11/15 2013 10/14/15 0627 10/15/15 0602  Weight: 71.668 kg (158 lb) 72.485 kg (159 lb 12.8 oz) 71.48 kg (157 lb 9.4 oz)    Objective: Physical Exam: Filed Vitals:   10/14/15 2218 10/15/15 0602 10/15/15 1401 10/15/15 1559  BP: 94/64 110/80 117/94 102/78  Pulse: 100 100 112 98  Temp:  98.2 F (36.8 C) 98.4 F (36.9 C) 97.9 F (36.6 C)  TempSrc:  Oral  Oral  Resp:    16  Height:      Weight:  71.48 kg (157 lb 9.4 oz)    SpO2: 100% 100% 98% 99%    General: Alert, Awake and Oriented to Time, Place and Person. Appear in mild distress Eyes: PERRL, Conjunctiva  normal ENT: Oral Mucosa clear moist. Neck: no JVD, no Abnormal Mass Or lumps Cardiovascular: S1 and S2 Present, no Murmur, Respiratory: Bilateral Air entry equal and Decreased, Clear to Auscultation, no Crackles, no wheezes Abdomen: Bowel Sound Sluggish, Soft and mild tenderness Extremities: no Pedal edema, no calf tenderness  Data Reviewed: CBC:  Recent Labs Lab 10/09/15 0740 10/11/15 2121 10/12/15 0541 10/13/15 0545 10/15/15 0506 10/15/15 1822  WBC 3.6* 5.0 4.8 3.8* 2.6* PENDING  NEUTROABS 2.5 3.7  --  2.9 1.6* PENDING  HGB 10.4* 13.5 12.5 12.0 11.9* 14.5  HCT 30.5* 40.1 36.7 36.2 34.8* 43.3  MCV 79.8 80.2 79.8 80.4 80.0 80.3  PLT 210 208 255 178 252 PENDING   Basic Metabolic Panel:  Recent Labs Lab 10/11/15 2121 10/12/15 0541 10/13/15 0545 10/14/15 0440 10/15/15 0506 10/15/15 1822  NA 137 136 135 134* 134* 133*  K 4.1 4.1 3.8 3.8 4.0 4.9  CL 104 105 107 106 105 105  CO2 27 25 24 23  21* 21*  GLUCOSE 103* 95 90 92 96 115*  BUN 34* 34* 36* 43* 53* 56*  CREATININE 1.51* 1.41* 1.55* 2.20* 2.75* 2.86*  CALCIUM 8.4* 8.0* 7.9* 7.6* 7.8* 7.8*  MG 1.8  --  1.9  --   --   --     Liver Function Tests:  Recent Labs Lab 10/09/15 0740 10/11/15 2121 10/13/15 0545 10/15/15 0506  AST 18 25 16 19   ALT 10* 9* 7* 10*  ALKPHOS 48 51 32* 28*  BILITOT 0.5 0.4 0.5 0.5  PROT 5.4* 5.6* 4.2* 4.5*  ALBUMIN 2.0* 2.0* 2.0* 2.2*    Recent Labs Lab 10/09/15 0740 10/15/15 0506  LIPASE 20 20   No results for input(s): AMMONIA in the last 168 hours. Coagulation Profile:  Recent Labs Lab 10/14/15 0440  INR 1.24   Cardiac Enzymes: No results for input(s): CKTOTAL, CKMB, CKMBINDEX, TROPONINI in the last 168 hours. BNP (last 3 results) No results for input(s): PROBNP in the last 8760 hours.  CBG:  Recent Labs Lab 10/15/15 1608  GLUCAP 119*    Studies: Dg Abd Portable 1v  10/15/2015  CLINICAL DATA:  Abdominal distension, tight sensation EXAM: PORTABLE ABDOMEN - 1  VIEW COMPARISON:  CT abdomen 10/11/2015, abdominal x-ray 10/14/2015 FINDINGS: There are multiple distended loops of small bowel. No significant colonic air is identified. There is no evidence of pneumoperitoneum, portal venous gas or pneumatosis. There are no pathologic calcifications along the expected course of the ureters. The osseous structures are unremarkable. IMPRESSION: Gaseous distention of small bowel without significant colonic air. Differential considerations include an ileus versus small-bowel obstruction. Electronically Signed   By: Elige KoHetal  Mithran Strike   On: 10/15/2015 19:05   [place] Abdomen Limited Ruq  10/15/2015  CLINICAL DATA:  Nausea and vomiting. Patient admitted 10/11/2015 with extensive bowel wall thickening, ascites and proteinuria. EXAM: US ABDOMEN LIMITED - RIGHT UPPER QUADRANT COMPARISON:  CT abdomen and pelvis 10/11/2015 P FINDINGS: Gallbladder: The gallbladder is filled with sludge. No stones or wall thickening are identified. Sonographer reports negative Murphy's sign. Ascites is noted. Common bile duct: Diameter: 0.4 cm Liver: No focal lesion identified. Within normal limits in parenchymal echogenicity. IMPRESSION: The gallbladder is  filled with sludge. No evidence of cholecystitis. Ascites. Electronically Signed   By: Drusilla Kanner M.D.   On: 10/15/2015 16:13     Scheduled Meds: . albumin human  25 g Intravenous Once  . famotidine (PEPCID) IV  20 mg Intravenous Q12H  . heparin subcutaneous  5,000 Units Subcutaneous Q8H  . polyethylene glycol-electrolytes  4,000 mL Oral Once   Continuous Infusions: . dextrose 5 % and 0.45 % NaCl with KCl 20 mEq/L 125 mL/hr at 10/15/15 1659   PRN Meds: acetaminophen, alum & mag hydroxide-simeth, morphine injection, ondansetron (ZOFRAN) IV  Time spent: 30 minutes  Author: Lynden Oxford, MD [facility] Pager: [phone] 10/15/2015 7:26 PM  If 7PM-7AM, please contact night-coverage at [email or web address], password [facility]

## 2015-10-15 NOTE — Progress Notes (Signed)
Subjective: Events since admission noted. Patient complains of ongoing abdominal pain, worse in the RUQ. The nausea and vomiting has resolved. She continues to have RUQ pain. The abdominal ultrasound was unrevealing. Abnormal loops of bowel noted on CT scan. Had a BM yesterday but suffers from chronic constipation.     Objective: Vital signs in last 24 hours: Temp:  [97.6 F (36.4 C)-98.4 F (36.9 C)] 97.9 F (36.6 C) (07/10 1559) Pulse Rate:  [98-125] 98 (07/10 1559) Resp:  [16] 16 (07/10 1559) BP: (94-117)/(64-94) 102/78 mmHg (07/10 1559) SpO2:  [98 %-100 %] 99 % (07/10 1559) Weight:  [71.48 kg (157 lb 9.4 oz)] 71.48 kg (157 lb 9.4 oz) (07/10 0602) Last BM Date: 10/14/15  Intake/Output from previous day: 07/09 0701 - 07/10 0700 In: 713.3 [I.V.:513.3; IV Piggyback:200] Out: -  Intake/Output this shift: Total I/O In: 240 [P.O.:240] Out: 350 [Urine:350]  General appearance: cooperative, fatigued and pale Resp: clear to auscultation bilaterally Cardio: regular rate and rhythm, S1, S2 normal, no murmur, click, rub or gallop GI: soft, distended with ascites; RUQ tenderness on palpation with no gaurding, rebound or rigidity; bowel sounds normal; no masses,  no organomegaly Extremities: extremities normal, atraumatic, no cyanosis or edema  Lab Results:  Recent Labs  10/13/15 0545 10/15/15 0506  WBC 3.8* 2.6*  HGB 12.0 11.9*  HCT 36.2 34.8*  PLT 178 252   BMET  Recent Labs  10/13/15 0545 10/14/15 0440 10/15/15 0506  NA 135 134* 134*  K 3.8 3.8 4.0  CL 107 106 105  CO2 24 23 21*  GLUCOSE 90 92 96  BUN 36* 43* 53*  CREATININE 1.55* 2.20* 2.75*  CALCIUM 7.9* 7.6* 7.8*   LFT  Recent Labs  10/15/15 0506  PROT 4.5*  ALBUMIN 2.2*  AST 19  ALT 10*  ALKPHOS 28*  BILITOT 0.5   PT/INR  Recent Labs  10/14/15 0440  LABPROT 15.8*  INR 1.24   Studies/Results: Dg Abd 2 Views  10/14/2015  CLINICAL DATA:  Abdominal distention 4 days EXAM: ABDOMEN - 2 VIEW  COMPARISON:  CT 10/11/2015 FINDINGS: No free air beneath hemidiaphragms. Dilated loops of small bowel up to 3.5 cm similar to comparison CT. There is gas throughout the small bowel and colon including the rectum. No pathologic calcifications IMPRESSION: 1. No intraperitoneal free air or evidence of high-grade bowel obstruction. 2. Dilated loops of small bowel in the RIGHT abdomen similar to comparison CT consistent with small bowel edema. Electronically Signed   By: Genevive BiStewart  Edmunds M.D.   On: 10/14/2015 12:46   [place] Abdomen Limited Ruq  10/15/2015  CLINICAL DATA:  Nausea and vomiting. Patient admitted 10/11/2015 with extensive bowel wall thickening, ascites and proteinuria. EXAM: US ABDOMEN LIMITED - RIGHT UPPER QUADRANT COMPARISON:  CT abdomen and pelvis 10/11/2015 P FINDINGS: Gallbladder: The gallbladder is filled with sludge. No stones or wall thickening are identified. Sonographer reports negative Murphy's sign. Ascites is noted. Common bile duct: Diameter: 0.4 cm Liver: No focal lesion identified. Within normal limits in parenchymal echogenicity. IMPRESSION: The gallbladder is filled with sludge. No evidence of cholecystitis. Ascites. Electronically Signed   By: Drusilla Kannerhomas  Dalessio M.D.   On: 10/15/2015 16:13   Medications: I have reviewed the patient's current medications.  Assessment/Plan: 1) Abdominal pain with abnormal CT scan-colonic and small bowel wall thickening/ascites-will prep today for a colonoscopy tomorrow.  2) GERD: On Famotidine.  3) AKI-BUN/Creatinine 53/2.75-renal on board-SLE being ruled out; multiple reasons for worsening renal function-contrast, Lasix administration   LOS:  3 days   Caroline Elliott 10/15/2015, 5:29 PM   

## 2015-10-15 NOTE — Progress Notes (Signed)
Admit: 10/11/2015 LOS: 3  55F with N/V, abd pain found to have ascites and thickened SB/ascending colon and 4+ protein on UA. Has mild AKI as well.  Subjective:  UP/C <1 ANA pending Still having abd pain / discomfort  SCR further up No clear I/Os; is voiding though  07/09 0701 - 07/10 0700 In: 713.3 [I.V.:513.3; IV Piggyback:200] Out: -   Filed Weights   10/11/15 2013 10/14/15 0627 10/15/15 0602  Weight: 71.668 kg (158 lb) 72.485 kg (159 lb 12.8 oz) 71.48 kg (157 lb 9.4 oz)    Scheduled Meds: . famotidine (PEPCID) IV  20 mg Intravenous Q12H  . heparin subcutaneous  5,000 Units Subcutaneous Q8H   Continuous Infusions: . dextrose 5 % and 0.45 % NaCl with KCl 20 mEq/L 75 mL/hr at 10/15/15 0816   PRN Meds:.acetaminophen, alum & mag hydroxide-simeth, morphine injection, ondansetron (ZOFRAN) IV  Current Labs: reviewed   RENAL STUDIES UP/C: 0.64 C3 low at 22, C4 3 HBV neg HIV neg RPR neg  ANA: pending   Physical Exam:  Blood pressure 110/80, pulse 100, temperature 98.2 F (36.8 C), temperature source Oral, resp. rate 17, height 5\' 4"  (1.626 m), weight 71.48 kg (157 lb 9.4 oz), last menstrual period 09/30/2015, SpO2 100 %. GEN: NAD ENT: NCAT EYES: EOMI CV: RRR PULM: CTAB ABD: s/nt/nd SKIN: no rashes/lesions EXT: Trace LEE b/l  A 1. Proteinuria, UP/C 0.6; not nephrotic; doesn't explain ascites/bowel wall thickening 2. Hypocomplementemia 3. Hypoalbuminemia 4. Ascites, low SAAG  5. Bowel Wall thickening 6. N/V, Abd Pain 7. AKI likely 2/2 contrast + paracentesis/fluids shifts  Plan 1. ANA pending, Still concern for SLE just not necessarily renal involvement 2. Repeat UA and UP/C today 3. Will need GI to weigh in again; still with sig abd symptoms 4. Daily weights, Daily Renal Panel, Strict I/Os, Avoid nephrotoxins (NSAIDs, judicious IV Contrast)    Sabra Heckyan Brittanee Ghazarian MD 10/15/2015, 10:26 AM   Recent Labs Lab 10/13/15 0545 10/14/15 0440 10/15/15 0506  NA 135  134* 134*  K 3.8 3.8 4.0  CL 107 106 105  CO2 24 23 21*  GLUCOSE 90 92 96  BUN 36* 43* 53*  CREATININE 1.55* 2.20* 2.75*  CALCIUM 7.9* 7.6* 7.8*    Recent Labs Lab 10/11/15 2121 10/12/15 0541 10/13/15 0545 10/15/15 0506  WBC 5.0 4.8 3.8* 2.6*  NEUTROABS 3.7  --  2.9 1.6*  HGB 13.5 12.5 12.0 11.9*  HCT 40.1 36.7 36.2 34.8*  MCV 80.2 79.8 80.4 80.0  PLT 208 255 178 252

## 2015-10-16 ENCOUNTER — Encounter (HOSPITAL_COMMUNITY)
Admission: AD | Disposition: A | Discharge status: Other Acute Inpatient Hospital | Payer: Self-pay | Source: Ambulatory Visit | Attending: Family Medicine

## 2015-10-16 ENCOUNTER — Encounter (HOSPITAL_COMMUNITY): Payer: Self-pay | Admitting: *Deleted

## 2015-10-16 ENCOUNTER — Inpatient Hospital Stay (HOSPITAL_COMMUNITY): Payer: Commercial Managed Care - HMO

## 2015-10-16 HISTORY — PX: ENTEROSCOPY: SHX5533

## 2015-10-16 LAB — MAGNESIUM: MAGNESIUM: 2.3 mg/dL (ref 1.7–2.4)

## 2015-10-16 LAB — DIRECT ANTIGLOBULIN TEST (NOT AT ARMC)
DAT, IGG: POSITIVE
DAT, complement: NEGATIVE

## 2015-10-16 LAB — COMPREHENSIVE METABOLIC PANEL
ALT: 13 U/L — AB (ref 14–54)
AST: 24 U/L (ref 15–41)
Albumin: 3 g/dL — ABNORMAL LOW (ref 3.5–5.0)
Alkaline Phosphatase: 32 U/L — ABNORMAL LOW (ref 38–126)
Anion gap: 7 (ref 5–15)
BILIRUBIN TOTAL: 0.5 mg/dL (ref 0.3–1.2)
BUN: 56 mg/dL — ABNORMAL HIGH (ref 6–20)
CHLORIDE: 105 mmol/L (ref 101–111)
CO2: 21 mmol/L — ABNORMAL LOW (ref 22–32)
CREATININE: 3.11 mg/dL — AB (ref 0.44–1.00)
Calcium: 8.1 mg/dL — ABNORMAL LOW (ref 8.9–10.3)
GFR, EST AFRICAN AMERICAN: 23 mL/min — AB (ref >60)
GFR, EST NON AFRICAN AMERICAN: 20 mL/min — AB (ref >60)
Glucose, Bld: 115 mg/dL — ABNORMAL HIGH (ref 65–99)
Potassium: 4.5 mmol/L (ref 3.5–5.1)
Sodium: 133 mmol/L — ABNORMAL LOW (ref 135–145)
TOTAL PROTEIN: 5.5 g/dL — AB (ref 6.5–8.1)

## 2015-10-16 LAB — SURGICAL PCR SCREEN
MRSA, PCR: NEGATIVE
Staphylococcus aureus: NEGATIVE

## 2015-10-16 LAB — PH, BODY FLUID: pH, Body Fluid: 7.6

## 2015-10-16 SURGERY — ENTEROSCOPY
Anesthesia: Moderate Sedation | Laterality: Left

## 2015-10-16 SURGERY — ENTEROSCOPY
Anesthesia: Moderate Sedation

## 2015-10-16 MED ORDER — MIDAZOLAM HCL 10 MG/2ML IJ SOLN
INTRAMUSCULAR | Status: DC | PRN
  Administered 2015-10-16 (×2): 2 mg via INTRAVENOUS
  Administered 2015-10-16: 1 mg via INTRAVENOUS

## 2015-10-16 MED ORDER — BUTAMBEN-TETRACAINE-BENZOCAINE 2-2-14 % EX AERO
INHALATION_SPRAY | CUTANEOUS | Status: DC | PRN
  Administered 2015-10-16: 2 via TOPICAL

## 2015-10-16 MED ORDER — SODIUM CHLORIDE 0.9 % IV SOLN: INTRAVENOUS | Status: DC

## 2015-10-16 MED ORDER — METHYLPREDNISOLONE SODIUM SUCC 40 MG IJ SOLR
40.0000 mg | Freq: Every day | INTRAMUSCULAR | Status: DC
Start: 2015-10-17 — End: 2015-10-17
  Administered 2015-10-17: 40 mg via INTRAVENOUS
  Filled 2015-10-16: qty 1

## 2015-10-16 MED ORDER — SODIUM CHLORIDE 0.9 % IV SOLN
INTRAVENOUS | Status: DC
  Administered 2015-10-16: 500 mL via INTRAVENOUS

## 2015-10-16 MED ORDER — FENTANYL CITRATE (PF) 100 MCG/2ML IJ SOLN
INTRAMUSCULAR | Status: DC | PRN
  Administered 2015-10-16 (×3): 25 ug via INTRAVENOUS

## 2015-10-16 MED ORDER — MIDAZOLAM HCL 5 MG/ML IJ SOLN
INTRAMUSCULAR | Status: AC
  Filled 2015-10-16: qty 2

## 2015-10-16 MED ORDER — FENTANYL CITRATE (PF) 100 MCG/2ML IJ SOLN
INTRAMUSCULAR | Status: AC
  Filled 2015-10-16: qty 2

## 2015-10-16 MED ORDER — FAMOTIDINE IN NACL 20-0.9 MG/50ML-% IV SOLN
20.0000 mg | INTRAVENOUS | Status: DC
  Administered 2015-10-17 – 2015-10-21 (×5): 20 mg via INTRAVENOUS
  Filled 2015-10-16 (×5): qty 50

## 2015-10-16 MED ORDER — PHENOL 1.4 % MT LIQD
1.0000 | OROMUCOSAL | Status: DC | PRN
  Administered 2015-10-16: 1 via OROMUCOSAL
  Filled 2015-10-16: qty 177

## 2015-10-16 MED ORDER — ONDANSETRON HCL 4 MG/2ML IJ SOLN
4.0000 mg | Freq: Three times a day (TID) | INTRAMUSCULAR | Status: DC
  Administered 2015-10-16 – 2015-10-23 (×9): 4 mg via INTRAVENOUS
  Filled 2015-10-16 (×17): qty 2

## 2015-10-16 NOTE — H&P (View-Only) (Signed)
Subjective: Events since admission noted. Patient complains of ongoing abdominal pain, worse in the RUQ. The nausea and vomiting has resolved. She continues to have RUQ pain. The abdominal ultrasound was unrevealing. Abnormal loops of bowel noted on CT scan. Had a BM yesterday but suffers from chronic constipation.     Objective: Vital signs in last 24 hours: Temp:  [97.6 F (36.4 C)-98.4 F (36.9 C)] 97.9 F (36.6 C) (07/10 1559) Pulse Rate:  [98-125] 98 (07/10 1559) Resp:  [16] 16 (07/10 1559) BP: (94-117)/(64-94) 102/78 mmHg (07/10 1559) SpO2:  [98 %-100 %] 99 % (07/10 1559) Weight:  [71.48 kg (157 lb 9.4 oz)] 71.48 kg (157 lb 9.4 oz) (07/10 0602) Last BM Date: 10/14/15  Intake/Output from previous day: 07/09 0701 - 07/10 0700 In: 713.3 [I.V.:513.3; IV Piggyback:200] Out: -  Intake/Output this shift: Total I/O In: 240 [P.O.:240] Out: 350 [Urine:350]  General appearance: cooperative, fatigued and pale Resp: clear to auscultation bilaterally Cardio: regular rate and rhythm, S1, S2 normal, no murmur, click, rub or gallop GI: soft, distended with ascites; RUQ tenderness on palpation with no gaurding, rebound or rigidity; bowel sounds normal; no masses,  no organomegaly Extremities: extremities normal, atraumatic, no cyanosis or edema  Lab Results:  Recent Labs  10/13/15 0545 10/15/15 0506  WBC 3.8* 2.6*  HGB 12.0 11.9*  HCT 36.2 34.8*  PLT 178 252   BMET  Recent Labs  10/13/15 0545 10/14/15 0440 10/15/15 0506  NA 135 134* 134*  K 3.8 3.8 4.0  CL 107 106 105  CO2 24 23 21*  GLUCOSE 90 92 96  BUN 36* 43* 53*  CREATININE 1.55* 2.20* 2.75*  CALCIUM 7.9* 7.6* 7.8*   LFT  Recent Labs  10/15/15 0506  PROT 4.5*  ALBUMIN 2.2*  AST 19  ALT 10*  ALKPHOS 28*  BILITOT 0.5   PT/INR  Recent Labs  10/14/15 0440  LABPROT 15.8*  INR 1.24   Studies/Results: Dg Abd 2 Views  10/14/2015  CLINICAL DATA:  Abdominal distention 4 days EXAM: ABDOMEN - 2 VIEW  COMPARISON:  CT 10/11/2015 FINDINGS: No free air beneath hemidiaphragms. Dilated loops of small bowel up to 3.5 cm similar to comparison CT. There is gas throughout the small bowel and colon including the rectum. No pathologic calcifications IMPRESSION: 1. No intraperitoneal free air or evidence of high-grade bowel obstruction. 2. Dilated loops of small bowel in the RIGHT abdomen similar to comparison CT consistent with small bowel edema. Electronically Signed   By: Genevive BiStewart  Edmunds M.D.   On: 10/14/2015 12:46   [place] Abdomen Limited Ruq  10/15/2015  CLINICAL DATA:  Nausea and vomiting. Patient admitted 10/11/2015 with extensive bowel wall thickening, ascites and proteinuria. EXAM: US ABDOMEN LIMITED - RIGHT UPPER QUADRANT COMPARISON:  CT abdomen and pelvis 10/11/2015 P FINDINGS: Gallbladder: The gallbladder is filled with sludge. No stones or wall thickening are identified. Sonographer reports negative Murphy's sign. Ascites is noted. Common bile duct: Diameter: 0.4 cm Liver: No focal lesion identified. Within normal limits in parenchymal echogenicity. IMPRESSION: The gallbladder is filled with sludge. No evidence of cholecystitis. Ascites. Electronically Signed   By: Drusilla Kannerhomas  Dalessio M.D.   On: 10/15/2015 16:13   Medications: I have reviewed the patient's current medications.  Assessment/Plan: 1) Abdominal pain with abnormal CT scan-colonic and small bowel wall thickening/ascites-will prep today for a colonoscopy tomorrow.  2) GERD: On Famotidine.  3) AKI-BUN/Creatinine 53/2.75-renal on board-SLE being ruled out; multiple reasons for worsening renal function-contrast, Lasix administration   LOS:  3 days   Cici Rodriges 10/15/2015, 5:29 PM

## 2015-10-16 NOTE — Op Note (Signed)
[facility] Patient Name: Caroline HausenMichelle Elliott Procedure Date : 10/16/2015 MRN: [id number] Attending MD: Jeani HawkingPatrick Laylia Mui , MD Date of Birth: Oct 26, 1990 CSN: [id number] Age: 25 Admit Type: Inpatient Procedure:                Small bowel enteroscopy Indications:              Abnormal abdominal CT Providers:                Jeani HawkingPatrick Mikena Masoner, MD, Nena PolioLisa Moore, RN, Lorenda IshiharaSam Tetteh,                            Technician Referring MD:              Medicines:                Fentanyl 75 micrograms IV, Midazolam 5 mg IV Complications:            No immediate complications. Estimated Blood Loss:     Estimated blood loss was minimal. Procedure:                Pre-Anesthesia Assessment:                           - Prior to the procedure, a History and Physical                            was performed, and patient medications and                            allergies were reviewed. The patient's tolerance of                            previous anesthesia was also reviewed. The risks                            and benefits of the procedure and the sedation                            options and risks were discussed with the patient.                            All questions were answered, and informed consent                            was obtained. Prior Anticoagulants: The patient has                            taken no previous anticoagulant or antiplatelet                            agents. ASA Grade Assessment: III - A patient with                            severe systemic disease. After reviewing the risks  and benefits, the patient was deemed in                            satisfactory condition to undergo the procedure.                           - Sedation was administered by an endoscopy nurse.                            The sedation level attained was moderate.                           After obtaining informed consent, the endoscope was                            passed  under direct vision. Throughout the                            procedure, the patient's blood pressure, pulse, and                            oxygen saturations were monitored continuously. The                            EC-[id number] ([id number]) scope was introduced through                            the mouth and advanced to the mid-jejunum. The                            small bowel enteroscopy was accomplished without                            difficulty. The patient tolerated the procedure                            well. Scope In: Scope Out: Findings:      The esophagus was normal.      The stomach was normal.      The examined duodenum was normal.      Normal mucosa was found in the jejunum. Biopsies were taken with a cold       forceps for histology.      I was able to passed the pediatric colonoscope a significant distance       into the jejunum, however, I do not know exactly how far I traveled. It       was clear that the small bowel was dilated consistent with an ileus       noted on the KUBs. There was a significant amount of retained fluid in       the small bowel and one liter of small bowel liquid contents were       suctioned. Biopsies in the presumed mid jejunum were obtained, but gross       evaluation of the mucosa was normal. I do not know if I reached the area       of abnormal  small bowel wall thickening as described on the CT scan with       this examination. After suctioning of the bowel contents her abdomen was       softer/less distended. SLE GI involvement in her situation may be an       intestinal pseudo-obstruction or a protein-losing enteropathy, however,       she lacks the diarrheal component with her symptoms. Impression:               - Normal esophagus.                           - Normal stomach.                           - Normal examined duodenum.                           - Normal mucosa was found in the jejunum. Biopsied. Recommendation:           -  Return patient to hospital ward for ongoing care.                           - NG tube to low intermittent suctioning.                           - Inpatient Rheumatology consultation is not                            available. There is the thought of SLE and an                            emperic trial of Solumedrol will be pursued. I                            discussed the case with Dr. Allena Katz.                           - If there is no improvement she may need to be                            transferred to a tertiary care center for further                            treatment. Procedure Code(s):        --- Professional ---                           [phone], Small intestinal endoscopy, enteroscopy                            beyond second portion of duodenum, not including                            ileum; with biopsy, single or multiple Diagnosis Code(s):        --- Professional ---  R93.3, Abnormal findings on diagnostic imaging of                            other parts of digestive tract CPT copyright 2016 [employer]. All rights reserved. The codes documented in this report are preliminary and upon coder review may  be revised to meet current compliance requirements. Jeani Hawking, MD Jeani Hawking, MD 10/16/2015 3:15:06 PM This report has been signed electronically. Number of Addenda: 0

## 2015-10-16 NOTE — Progress Notes (Signed)
Patient stated that she felt better and her stomach was not much bloated anymore but complaint of throat irritation.  Gave PRN chloraseptic spray and noted it helped.  She does not the suds enema administered at this time since she was feeling much better.

## 2015-10-16 NOTE — Progress Notes (Signed)
[facility] Progress Note  Patient: Caroline Elliott:[id number]   PCP: Antony Blackbird, MD DOB: 10/03/1990   DOA: 10/11/2015   DOS: 10/16/2015   Date of Service: the patient was seen and examined on 10/16/2015  Subjective: Patient continues to complain of significant abdominal distention cannot complete bowel prep. Also complains of abdominal pain. Nutrition: minimal oral intake  Brief hospital course: Pt. with PMH of GERD; admitted on 10/11/2015, with complaint of epigastric pain, was found to have enterocolitis as well as ascites. Patient underwent paracentesis and the findings were consistent with possible nephrotic syndrome, nephrology was consulted. Patient was given IV Lasix and due to worsening of renal function, it was discontinued. GI performed enteroscopy and biopsy, they feel patient's bowel distention is consistent with ileus. 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 Further autoantibody workup is currently pending. Currently further plan ensure bowel motility prior to discharge and follow-up with rheumatology as soon as possible after discharge.  Assessment and Plan: 1. 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 CT of the abdomen was positive for ascites as well as small bowel and ascending colon thickening. Patient underwent paracentesis and the fluid ratio was consistent with nephrotic syndrome. With history of proteinuria as well as hypoalbuminemia as well as acute kidney injury nephrology was consulted for further workup. Pt was given IV Lasix 20 mg twice a day, stop due to worsening renal function. currently on IV fluids S/P 25 g of IV albumin 3. Renal function currently worsening.  No evidence of nephrotic syndrome. Possibility of autoimmune syndrome can not be rule out due to hypocomplementemia.   2. Suspected  enterocolitis. Obstipation- now constipation, ileus CT scan was positive for small bowel wall thickening. Gastroenterology was consulted who feels that patient does not have any evidence of infection ongoing and therefore recommended to stop the antibiotic. Recommended patient ambulated every 4 hours. Repeat x ray on 07/09 shows dilated bowel loops and no obstruction. Also has bowel wall thickening similar to earlier CT. Status post enteroscopy and biopsy. Symptoms consistent with ileus per GI, will try soapsuds enema 1 and also scheduled Zofran. NG tube inserted and patient will remain nothing by mouth at present.  3. GERD. Continuing IV Pepcid.  4. Autoimmune disease. 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 Further autoimmune workup is ordered. Per my discussion with GI 40 mg of IV telemetry and will be initiated starting 10/17/2015. Patient will require outpatient rheumatology follow-up for further workup. Normal ESR and CRP rules out vasculitis.  Pain management: When necessary Tylenol, morphine Activity: No indication for physical therapy Bowel regimen: last BM 10/14/2015, after suppository Diet: Nothing by mouth DVT Prophylaxis: subcutaneous Heparin  Advance goals of care discussion: Full code  Family Communication: family was present at bedside, at the time of interview. The pt provided permission to discuss medical plan with the family. Opportunity was given to ask question and all questions were answered satisfactorily.   Disposition:  Discharge to home. Expected discharge date: 10/21/2015, workup as per gastroenterology, improvement in renal function  Consultants: Gastroenterology, nephrology Procedures: Ultrasound-guided paracentesis  Antibiotics: Anti-infectives    Start     Dose/Rate Route Frequency Ordered Stop   10/11/15 2200  metroNIDAZOLE (FLAGYL) IVPB 500 mg  Status:  Discontinued     500 mg 100 mL/hr over 60 Minutes  Intravenous Every 8 hours 10/11/15 2118 10/12/15  1547   10/11/15 2200  ciprofloxacin (CIPRO) IVPB 400 mg  Status:  Discontinued     400 mg 200 mL/hr over 60 Minutes Intravenous Every 12 hours 10/11/15 2118 10/12/15 1547        Intake/Output Summary (Last 24 hours) at 10/16/15 2105 Last data filed at 10/16/15 1804  Gross per 24 hour  Intake 1581.25 ml  Output    675 ml  Net 906.25 ml   Filed Weights   10/14/15 0627 10/15/15 0602 10/16/15 0500  Weight: 72.485 kg (159 lb 12.8 oz) 71.48 kg (157 lb 9.4 oz) 75.6 kg (166 lb 10.7 oz)    Objective: Physical Exam: Filed Vitals:   10/16/15 1530 10/16/15 1616 10/16/15 1652 10/16/15 2002  BP: 122/86 114/80 122/89 115/81  Pulse: 101 96 116 109  Temp:    97.5 F (36.4 C)  TempSrc:    Oral  Resp: _0 Height:      Weight:      SpO2: 99% 100% 100% 98%   General: Alert, Awake and Oriented to Time, Place and Person. Appear in mild distress Eyes: PERRL, Conjunctiva normal ENT: Oral Mucosa clear moist. Neck: no JVD, no Abnormal Mass Or lumps Cardiovascular: S1 and S2 Present, no Murmur, Respiratory: Bilateral Air entry equal and Decreased, Clear to Auscultation, no Crackles, no wheezes Abdomen: Bowel Sound Absent, Soft and mild tenderness Extremities: no Pedal edema, no calf tenderness  Data Reviewed: CBC:  Recent Labs Lab 10/11/15 2121 10/12/15 0541 10/13/15 0545 10/15/15 0506 10/15/15 1822  WBC 5.0 4.8 3.8* 2.6* 3.3*  NEUTROABS 3.7  --  2.9 1.6* 2.5  HGB 13.5 12.5 12.0 11.9* 14.5  HCT 40.1 36.7 36.2 34.8* 43.3  MCV 80.2 79.8 80.4 80.0 80.3  PLT 208 255 178 252 782   Basic Metabolic Panel:  Recent Labs Lab 10/11/15 2121  10/13/15 0545 10/14/15 0440 10/15/15 0506 10/15/15 1822 10/16/15 0545  NA 137  < > 135 134* 134* 133* 133*  K 4.1  < > 3.8 3.8 4.0 4.9 4.5  CL 104  < > 107 106 105 105 105  CO2 27  < > 24 23 21* 21* 21*  GLUCOSE 103*  < > 90 92 96 115* 115*  BUN 34*  < > 36* 43* 53* 56* 56*  CREATININE  1.51*  < > 1.55* 2.20* 2.75* 2.86* 3.11*  CALCIUM 8.4*  < > 7.9* 7.6* 7.8* 7.8* 8.1*  MG 1.8  --  1.9  --   --   --  2.3  < > = values in this interval not displayed.  Liver Function Tests:  Recent Labs Lab 10/11/15 2121 10/13/15 0545 10/15/15 0506 10/16/15 0545  AST _1 ALT 9* 7* 10* 13*  ALKPHOS 51 32* 28* 32*  BILITOT 0.4 0.5 0.5 0.5  PROT 5.6* 4.2* 4.5* 5.5*  ALBUMIN 2.0* 2.0* 2.2* 3.0*    Recent Labs Lab 10/15/15 0506  LIPASE 20   No results for input(s): AMMONIA in the last 168 hours. Coagulation Profile:  Recent Labs Lab 10/14/15 0440  INR 1.24   Cardiac Enzymes: No results for input(s): CKTOTAL, CKMB, CKMBINDEX, TROPONINI in the last 168 hours. BNP (last 3 results) No results for input(s): PROBNP in the last 8760 hours.  CBG:  Recent Labs Lab 10/15/15 1608  GLUCAP 119*    Studies: Dg Abd Portable 1v  10/16/2015  CLINICAL DATA:  Check nasogastric catheter placement EXAM: PORTABLE ABDOMEN - 1 VIEW COMPARISON:  None.  FINDINGS: Scattered large and small bowel gas is noted. A nasogastric catheter is coiled within the stomach. No free air is seen. No bony abnormality is noted. IMPRESSION: Nasogastric catheter within stomach. Electronically Signed   By: Inez Catalina M.D.   On: 10/16/2015 17:28     Scheduled Meds: . [START ON 10/17/2015] famotidine (PEPCID) IV  20 mg Intravenous Q24H  . heparin subcutaneous  5,000 Units Subcutaneous Q8H  . ondansetron (ZOFRAN) IV  4 mg Intravenous Q8H   Continuous Infusions:   PRN Meds: acetaminophen, alum & mag hydroxide-simeth, morphine injection, ondansetron (ZOFRAN) IV, phenol  Time spent: 30 minutes  Author: Berle Mull, MD [facility] Pager: [phone] 10/16/2015 9:05 PM  If 7PM-7AM, please contact night-coverage at [email or web address], password [facility]

## 2015-10-16 NOTE — Progress Notes (Signed)
Admit: 10/11/2015 LOS: 4  [name] with N/V, abd pain found to have ascites and thickened SB/ascending colon and 4+ protein on UA. Has mild AKI as well.  Subjective:  Repeat UP/C nearly normal UA with [name]; 0.5 RBC/HPF SCr up a tad more today ANA positive 1:1280 CSY today planned  07/10 0701 - 07/11 0700 In: 2819.2 [P.O.:340; I.V.:2279.2; IV Piggyback:200] Out: 575 [Urine:575]  Filed Weights   10/14/15 0627 10/15/15 0602 10/16/15 0500  Weight: 72.485 kg (159 lb 12.8 oz) 71.48 kg (157 lb 9.4 oz) 75.6 kg (166 lb 10.7 oz)    Scheduled Meds: . [START ON 10/17/2015] famotidine (PEPCID) IV  20 mg Intravenous Q24H  . heparin subcutaneous  5,000 Units Subcutaneous Q8H   Continuous Infusions:   PRN Meds:.acetaminophen, alum & mag hydroxide-simeth, morphine injection, ondansetron (ZOFRAN) IV  Current Labs: reviewed   RENAL STUDIES UP/C: 0.64 C3 low at 22, C4 3 HBV neg HIV neg RPR neg  ANA: positive 1:1280   Physical Exam:  Blood pressure 115/78, pulse 107, temperature 97.6 F (36.4 C), temperature source Oral, resp. rate 16, height 5\' 4"  (1.626 m), weight 75.6 kg (166 lb 10.7 oz), last menstrual period 09/30/2015, SpO2 99 %. GEN: NAD ENT: NCAT EYES: EOMI CV: RRR PULM: CTAB ABD: s/nt/nd SKIN: no rashes/lesions EXT: Trace LEE b/l  A 1. Proteinuria, UP/C 0.3 -- 0.6; not nephrotic; doesn't explain ascites/bowel wall thickening; no clear involvement of SLE in kidneys 2. 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 Hypoalbuminemia 4. Ascites, low SAAG  5. Bowel Wall thickening 6. N/V, Abd Pain I wonder if connected to #2 7. AKI likely 2/2 contrast + paracentesis/fluids shifts  Plan 1. 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 IS based on GI evaluation / thoughts 3. Will need c lose f/u with rheumatology 4. AKI more likely CIN + paracentesis 5. Daily weights, Daily Renal Panel, Strict I/Os, Avoid nephrotoxins (NSAIDs,  judicious IV Contrast)    Caroline Heckyan Essa Malachi MD 10/16/2015, 11:06 AM   Recent Labs Lab 10/15/15 0506 10/15/15 1822 10/16/15 0545  NA 134* 133* 133*  K 4.0 4.9 4.5  CL 105 105 105  CO2 21* 21* 21*  GLUCOSE 96 115* 115*  BUN 53* 56* 56*  CREATININE 2.75* 2.86* 3.11*  CALCIUM 7.8* 7.8* 8.1*    Recent Labs Lab 10/13/15 0545 10/15/15 0506 10/15/15 1822  WBC 3.8* 2.6* 3.3*  NEUTROABS 2.9 1.6* 2.5  HGB 12.0 11.9* 14.5  HCT 36.2 34.8* 43.3  MCV 80.4 80.0 80.3  PLT 178 252 250

## 2015-10-16 NOTE — Progress Notes (Signed)
Patient unable to finish prep for colonoscopy and only drank 360 mL by midnight since patient's abdomen is distended.  Patient is NPO due to NM Hepatobiliary Liver Function Test at 10/16/15 at 0800.  Remaining Nulytely/golytely kept at refrigerator.

## 2015-10-16 NOTE — Interval H&P Note (Signed)
History and Physical Interval Note:  10/16/2015 2:21 PM  Caroline Elliott  has presented today for surgery, with the diagnosis of Abnormal CT scan-?colitis on CT  The various methods of treatment have been discussed with the patient and family. After consideration of risks, benefits and other options for treatment, the patient has consented to  Procedure(s): ENTEROSCOPY (N/A) as a surgical intervention .  The patient's history has been reviewed, patient examined, no change in status, stable for surgery.  I have reviewed the patient's chart and labs.  Questions were answered to the patient's satisfaction.     Blandina Renaldo D

## 2015-10-17 ENCOUNTER — Inpatient Hospital Stay (HOSPITAL_COMMUNITY): Payer: Commercial Managed Care - HMO

## 2015-10-17 LAB — COMPREHENSIVE METABOLIC PANEL
ALBUMIN: 2.5 g/dL — AB (ref 3.5–5.0)
ALK PHOS: 34 U/L — AB (ref 38–126)
ALT: 13 U/L — ABNORMAL LOW (ref 14–54)
ANION GAP: 7 (ref 5–15)
AST: 22 U/L (ref 15–41)
BUN: 63 mg/dL — ABNORMAL HIGH (ref 6–20)
CALCIUM: 8.1 mg/dL — AB (ref 8.9–10.3)
CO2: 21 mmol/L — ABNORMAL LOW (ref 22–32)
Chloride: 106 mmol/L (ref 101–111)
Creatinine, Ser: 3.22 mg/dL — ABNORMAL HIGH (ref 0.44–1.00)
GFR calc Af Amer: 22 mL/min — ABNORMAL LOW (ref >60)
GFR calc non Af Amer: 19 mL/min — ABNORMAL LOW (ref >60)
Glucose, Bld: 96 mg/dL (ref 65–99)
Potassium: 5.1 mmol/L (ref 3.5–5.1)
SODIUM: 134 mmol/L — AB (ref 135–145)
TOTAL PROTEIN: 5.1 g/dL — AB (ref 6.5–8.1)
Total Bilirubin: 0.8 mg/dL (ref 0.3–1.2)

## 2015-10-17 LAB — CULTURE, BODY FLUID W GRAM STAIN -BOTTLE: Culture: NO GROWTH

## 2015-10-17 LAB — CULTURE, BODY FLUID-BOTTLE

## 2015-10-17 MED ORDER — DEXTROSE IN LACTATED RINGERS 5 % IV SOLN
INTRAVENOUS | Status: DC
  Administered 2015-10-17: 14:00:00 via INTRAVENOUS

## 2015-10-17 MED ORDER — MORPHINE SULFATE (PF) 4 MG/ML IV SOLN
INTRAVENOUS | Status: AC
  Administered 2015-10-17: 3 mg
  Filled 2015-10-17: qty 1

## 2015-10-17 MED ORDER — MORPHINE SULFATE (PF) 4 MG/ML IV SOLN
3.0000 mg | Freq: Once | INTRAVENOUS | Status: DC
  Filled 2015-10-17: qty 1

## 2015-10-17 MED ORDER — METHYLPREDNISOLONE SODIUM SUCC 40 MG IJ SOLR
40.0000 mg | Freq: Three times a day (TID) | INTRAMUSCULAR | Status: DC
  Administered 2015-10-17 – 2015-10-20 (×10): 40 mg via INTRAVENOUS
  Filled 2015-10-17 (×11): qty 1

## 2015-10-17 MED ORDER — TECHNETIUM TC 99M MEBROFENIN IV KIT: 5.0000 | PACK | Freq: Once | INTRAVENOUS | Status: DC | PRN

## 2015-10-17 NOTE — Progress Notes (Signed)
[facility]  PROGRESS NOTE  Charleston PootMichelle N Sigmon ZOX:[id number] DOB: 1990-11-16 DOA: 10/11/2015 PCP: Cain SaupeFULP, CAMMIE, MD    Brief HPI:  Pt. with PMH of GERD; admitted on 10/11/2015, with complaint of epigastric pain, was found to have enterocolitis as well as ascites. Patient underwent paracentesis and the findings were consistent with possible nephrotic syndrome, nephrology was consulted.  Principal Problem:   Ascites Active Problems:   GERD (gastroesophageal reflux disease)   Abdominal pain, epigastric   Enterocolitis   Nausea and vomiting   Lymphadenopathy   Acute kidney injury (HCC)   Abdominal distension   Assessment/Plan: 1. Acute kidney injury/hypoalbuminemia/ascites- patient came with epigastric abdominal pain, CT of the abdomen was positive for ascites as well as small bowel and ascending colon thickening. Patient underwent paracentesis and fluid analysis was consistent with nephrotic syndrome. Nephrology consulted and deemed that it is not nephrotic syndrome. 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 Dose of Solumedrol changed to 40 mg IV q 8 hr. 2. ? Enterocolitis - CT scan abdomen was positive for small bowel wall thickening, gastroenterology was consulted, antibiotics were discontinued. Patient underwent enteroscopy and biopsy, patient found to have ileus so NG tube tube was inserted. NG tube now has been discontinued. Awaiting biopsy results.   DVT prophylaxis: Heparin Code Status: Full code Family Communication: Discussed with mother at bedside Disposition Plan: Pending improvement in symptoms   Consultants:  GI  Procedures:  Small bowel enteroscopy  Antibiotics:  None   Subjective: Patient seen and examined, NG tube was placed yesterday. Wants NG out.  Objective: Filed Vitals:   10/16/15 2002 10/17/15 0621 10/17/15 0934 10/17/15 1500  BP: 115/81 109/71 102/61 95/64  Pulse: 109 116 117 105  Temp: 97.5 F (36.4  C) 98.1 F (36.7 C) 98.1 F (36.7 C) 98.4 F (36.9 C)  TempSrc: Oral Oral Oral Oral  Resp: 16 18 18 18   Height:      Weight:  75.2 kg (165 lb 12.6 oz)    SpO2: 98% 100% 99% 100%    Intake/Output Summary (Last 24 hours) at 10/17/15 1606 Last data filed at 10/17/15 1508  Gross per 24 hour  Intake      0 ml  Output    300 ml  Net   -300 ml   Filed Weights   10/15/15 0602 10/16/15 0500 10/17/15 0621  Weight: 71.48 kg (157 lb 9.4 oz) 75.6 kg (166 lb 10.7 oz) 75.2 kg (165 lb 12.6 oz)    Examination:  General exam: Appears calm and comfortable  Respiratory system: Clear to auscultation. Respiratory effort normal. Cardiovascular system: S1 & S2 heard, RRR. No JVD, murmurs, rubs, gallops or clicks. No pedal edema. Gastrointestinal system: Abdomen is nondistended, soft and nontender. No organomegaly or masses felt. Normal bowel sounds heard. Central nervous system: Alert and oriented. No focal neurological deficits. Extremities: Symmetric 5 x 5 power. Skin: No rashes, lesions or ulcers Psychiatry: Judgement and insight appear normal. Mood & affect appropriate.    Data Reviewed: I have personally reviewed following labs and imaging studies Basic Metabolic Panel:  Recent Labs Lab 10/11/15 2121  10/13/15 0545 10/14/15 0440 10/15/15 0506 10/15/15 1822 10/16/15 0545 10/17/15 1200  NA 137  < > 135 134* 134* 133* 133* 134*  K 4.1  < > 3.8 3.8 4.0 4.9 4.5 5.1  CL 104  < > 107 106 105 105 105 106  CO2 27  < > 24 23 21* 21* 21* 21*  GLUCOSE 103*  < >  90 92 96 115* 115* 96  BUN 34*  < > 36* 43* 53* 56* 56* 63*  CREATININE 1.51*  < > 1.55* 2.20* 2.75* 2.86* 3.11* 3.22*  CALCIUM 8.4*  < > 7.9* 7.6* 7.8* 7.8* 8.1* 8.1*  MG 1.8  --  1.9  --   --   --  2.3  --   < > = values in this interval not displayed. Liver Function Tests:  Recent Labs Lab 10/11/15 2121 10/13/15 0545 10/15/15 0506 10/16/15 0545 10/17/15 1200  AST ALT 9* 7* 10* 13* 13*  ALKPHOS 51 32*  28* 32* 34*  BILITOT 0.4 0.5 0.5 0.5 0.8  PROT 5.6* 4.2* 4.5* 5.5* 5.1*  ALBUMIN 2.0* 2.0* 2.2* 3.0* 2.5*    Recent Labs Lab 10/15/15 0506  LIPASE 20   No results for input(s): AMMONIA in the last 168 hours. CBC:  Recent Labs Lab 10/11/15 2121 10/12/15 0541 10/13/15 0545 10/15/15 0506 10/15/15 1822  WBC 5.0 4.8 3.8* 2.6* 3.3*  NEUTROABS 3.7  --  2.9 1.6* 2.5  HGB 13.5 12.5 12.0 11.9* 14.5  HCT 40.1 36.7 36.2 34.8* 43.3  MCV 80.2 79.8 80.4 80.0 80.3  PLT 208 255 178 252 250   Cardiac Enzymes: No results for input(s): CKTOTAL, CKMB, CKMBINDEX, TROPONINI in the last 168 hours. BNP (last 3 results) No results for input(s): BNP in the last 8760 hours.  ProBNP (last 3 results) No results for input(s): PROBNP in the last 8760 hours.  CBG:  Recent Labs Lab 10/15/15 1608  GLUCAP 119*    Recent Results (from the past 240 hour(s))  Culture, body fluid-bottle     Status: None   Collection Time: 10/12/15 10:47 AM  Result Value Ref Range Status   Specimen Description FLUID PERITONEAL  Final   Special Requests NONE  Final   Culture NO GROWTH 5 DAYS  Final   Report Status 10/17/2015 FINAL  Final  Gram stain     Status: None   Collection Time: 10/12/15 10:47 AM  Result Value Ref Range Status   Specimen Description FLUID PERITONEAL  Final   Special Requests NONE  Final   Gram Stain   Final    RARE WBC PRESENT,BOTH PMN AND MONONUCLEAR NO ORGANISMS SEEN    Report Status 10/12/2015 FINAL  Final  Surgical pcr screen     Status: None   Collection Time: 10/16/15 11:26 AM  Result Value Ref Range Status   MRSA, PCR NEGATIVE NEGATIVE Final   Staphylococcus aureus NEGATIVE NEGATIVE Final    Comment:        The Xpert SA Assay (FDA approved for NASAL specimens in patients over 65 years of age), is one component of a comprehensive surveillance program.  Test performance has been validated by [facility] for patients greater than or equal to 69 year old. It is not  intended to diagnose infection nor to guide or monitor treatment.      Studies: Nm Hepatobiliary Liver Func  10/17/2015  CLINICAL DATA:  25 year old female with history of right upper quadrant abdominal pain for 1 week. Nausea and vomiting. Pain worsens with fatty meals. EXAM: NUCLEAR MEDICINE HEPATOBILIARY IMAGING TECHNIQUE: Sequential images of the abdomen were obtained out to 60 minutes following intravenous administration of radiopharmaceutical. RADIOPHARMACEUTICALS:  5.2 mCi Tc-79m  Choletec IV COMPARISON:  No priors.  CT the abdomen and pelvis 10/11/2015. FINDINGS: Prompt uptake and biliary excretion of activity by the liver is seen. Gallbladder activity is  not visualized at any point during the examination, including after administration of 3 mg of IV morphine. Biliary activity passes into small bowel, consistent with patent common bile duct. IMPRESSION: 1. Nonvisualization of the gallbladder suggestive of cystic duct obstruction. This could indicate acute cholecystitis in the appropriate clinical setting (particularly in light of extensive biliary sludge noted in the gallbladder on prior ultrasound 10/15/2015); however, there multiple causes of false positivity on these examinations, including prolonged fasting. Clinical correlation is recommended. Electronically Signed   By: Trudie Reed M.D.   On: 10/17/2015 10:05   Dg Abd Portable 1v  10/16/2015  CLINICAL DATA:  Check nasogastric catheter placement EXAM: PORTABLE ABDOMEN - 1 VIEW COMPARISON:  None. FINDINGS: Scattered large and small bowel gas is noted. A nasogastric catheter is coiled within the stomach. No free air is seen. No bony abnormality is noted. IMPRESSION: Nasogastric catheter within stomach. Electronically Signed   By: Alcide Clever M.D.   On: 10/16/2015 17:28   Dg Abd Portable 1v  10/15/2015  CLINICAL DATA:  Abdominal distension, tight sensation EXAM: PORTABLE ABDOMEN - 1 VIEW COMPARISON:  CT abdomen 10/11/2015, abdominal  x-ray 10/14/2015 FINDINGS: There are multiple distended loops of small bowel. No significant colonic air is identified. There is no evidence of pneumoperitoneum, portal venous gas or pneumatosis. There are no pathologic calcifications along the expected course of the ureters. The osseous structures are unremarkable. IMPRESSION: Gaseous distention of small bowel without significant colonic air. Differential considerations include an ileus versus small-bowel obstruction. Electronically Signed   By: Elige Ko   On: 10/15/2015 19:05    Scheduled Meds: . famotidine (PEPCID) IV  20 mg Intravenous Q24H  . heparin subcutaneous  5,000 Units Subcutaneous Q8H  . methylPREDNISolone (SOLU-MEDROL) injection  40 mg Intravenous Q8H  .  morphine injection  3 mg Intravenous Once  . ondansetron (ZOFRAN) IV  4 mg Intravenous Q8H   Continuous Infusions: . dextrose 5% lactated ringers 50 mL/hr at 10/17/15 1347       Time spent: 25 min    [facility]  [facility] Pager [phone]. If 7PM-7AM, please contact night-coverage at [email or web address], Office  [phone]  password [email or web address] 10/17/2015, 4:06 PM  LOS: 5 days

## 2015-10-17 NOTE — Progress Notes (Signed)
Admit: 10/11/2015 LOS: 5  26F with N/V, abd pain found to have ascites and thickened SB/ascending colon and 4+ protein on UA. Has mild AKI as well.  Subjective:  EGD yesterday - bx taken NGT in place for decompression Started on soumedrol No AM labs  07/11 0701 - 07/12 0700 In: 218.8 [I.V.:218.8] Out: 550 [Urine:450; Emesis/NG output:100]  Filed Weights   10/15/15 0602 10/16/15 0500 10/17/15 0621  Weight: 71.48 kg (157 lb 9.4 oz) 75.6 kg (166 lb 10.7 oz) 75.2 kg (165 lb 12.6 oz)    Scheduled Meds: . famotidine (PEPCID) IV  20 mg Intravenous Q24H  . heparin subcutaneous  5,000 Units Subcutaneous Q8H  . methylPREDNISolone (SOLU-MEDROL) injection  40 mg Intravenous Daily  .  morphine injection  3 mg Intravenous Once  . ondansetron (ZOFRAN) IV  4 mg Intravenous Q8H   Continuous Infusions:   PRN Meds:.acetaminophen, alum & mag hydroxide-simeth, morphine injection, ondansetron (ZOFRAN) IV, phenol, technetium TC 22M mebrofenin  Current Labs: reviewed   RENAL STUDIES UP/C: 0.64 C3 low at 22, C4 3 HBV neg HIV neg RPR neg  ANA: positive 1:1280   Physical Exam:  Blood pressure 109/71, pulse 116, temperature 98.1 F (36.7 C), temperature source Oral, resp. rate 18, height 5\' 4"  (1.626 m), weight 75.2 kg (165 lb 12.6 oz), last menstrual period 09/30/2015, SpO2 100 %. GEN: NAD ENT: NCAT, NGT in place EYES: EOMI CV: RRR PULM: CTAB ABD: s/nt/nd SKIN: no rashes/lesions EXT: Trace LEE b/l  A 1. Proteinuria, UP/C 0.3 -- 0.6; not nephrotic; doesn't explain ascites/bowel wall thickening; no clear involvement of SLE in kidneys 2. 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 Hypoalbuminemia 4. Ascites, low SAAG  5. Bowel Wall thickening 6. N/V, Abd Pain I wonder if connected to #2 7. AKI likely 2/2 contrast + paracentesis/fluids shifts  Plan 1. 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 IS based  on GI evaluation / thoughts 3. Will need c lose f/u with rheumatology 4. AKI more likely CIN + paracentesis -- await AM labs 5. Family updated at bedside 6. Daily weights, Daily Renal Panel, Strict I/Os, Avoid nephrotoxins (NSAIDs, judicious IV Contrast)    Sabra Heckyan Shaana Acocella MD 10/17/2015, 9:02 AM   Recent Labs Lab 10/15/15 0506 10/15/15 1822 10/16/15 0545  NA 134* 133* 133*  K 4.0 4.9 4.5  CL 105 105 105  CO2 21* 21* 21*  GLUCOSE 96 115* 115*  BUN 53* 56* 56*  CREATININE 2.75* 2.86* 3.11*  CALCIUM 7.8* 7.8* 8.1*    Recent Labs Lab 10/13/15 0545 10/15/15 0506 10/15/15 1822  WBC 3.8* 2.6* 3.3*  NEUTROABS 2.9 1.6* 2.5  HGB 12.0 11.9* 14.5  HCT 36.2 34.8* 43.3  MCV 80.4 80.0 80.3  PLT 178 252 250

## 2015-10-17 NOTE — Progress Notes (Signed)
Subjective: Since I last evaluated the patient, she is doing much better. Her abdominal pain and bloating has improved significantly. She has had 3 soft BM's today. She has noticed some blood in the urine but denies having any urinary symptoms.    Objective: Vital signs in last 24 hours: Temp:  [97.5 F (36.4 C)-98.4 F (36.9 C)] 98.4 F (36.9 C) (07/12 1500) Pulse Rate:  [105-117] 105 (07/12 1500) Resp:  [16-18] 18 (07/12 1500) BP: (95-115)/(61-81) 95/64 mmHg (07/12 1500) SpO2:  [98 %-100 %] 100 % (07/12 1500) Weight:  [75.2 kg (165 lb 12.6 oz)] 75.2 kg (165 lb 12.6 oz) (07/12 0621) Last BM Date: 10/15/15  Intake/Output from previous day: 07/11 0701 - 07/12 0700 In: 218.8 [I.V.:218.8] Out: 550 [Urine:450; Emesis/NG output:100] Intake/Output this shift:   General appearance: alert, cooperative, appears stated age, no distress and pale Resp: clear to auscultation bilaterally Cardio: regular rate and rhythm, S1, S2 normal, no murmur, click, rub or gallop GI: soft, non-tender; bowel sounds normal; no masses,  no organomegaly Extremities: extremities normal, atraumatic, no cyanosis or edema  Lab Results:  Recent Labs  10/15/15 0506 10/15/15 1822  WBC 2.6* 3.3*  HGB 11.9* 14.5  HCT 34.8* 43.3  PLT 252 250   BMET  Recent Labs  10/15/15 1822 10/16/15 0545 10/17/15 1200  NA 133* 133* 134*  K 4.9 4.5 5.1  CL 105 105 106  CO2 21* 21* 21*  GLUCOSE 115* 115* 96  BUN 56* 56* 63*  CREATININE 2.86* 3.11* 3.22*  CALCIUM 7.8* 8.1* 8.1*   LFT  Recent Labs  10/17/15 1200  PROT 5.1*  ALBUMIN 2.5*  AST 22  ALT 13*  ALKPHOS 34*  BILITOT 0.8   Studies/Results: Nm Hepatobiliary Liver Func  10/17/2015  CLINICAL DATA:  25 year old female with history of right upper quadrant abdominal pain for 1 week. Nausea and vomiting. Pain worsens with fatty meals. EXAM: NUCLEAR MEDICINE HEPATOBILIARY IMAGING TECHNIQUE: Sequential images of the abdomen were obtained out to 60 minutes  following intravenous administration of radiopharmaceutical. RADIOPHARMACEUTICALS:  5.2 mCi Tc-5184m  Choletec IV COMPARISON:  No priors.  CT the abdomen and pelvis 10/11/2015. FINDINGS: Prompt uptake and biliary excretion of activity by the liver is seen. Gallbladder activity is not visualized at any point during the examination, including after administration of 3 mg of IV morphine. Biliary activity passes into small bowel, consistent with patent common bile duct. IMPRESSION: 1. Nonvisualization of the gallbladder suggestive of cystic duct obstruction. This could indicate acute cholecystitis in the appropriate clinical setting (particularly in light of extensive biliary sludge noted in the gallbladder on prior ultrasound 10/15/2015); however, there multiple causes of false positivity on these examinations, including prolonged fasting. Clinical correlation is recommended. Electronically Signed   By: Trudie Reedaniel  Entrikin M.D.   On: 10/17/2015 10:05   Dg Abd Portable 1v  10/16/2015  CLINICAL DATA:  Check nasogastric catheter placement EXAM: PORTABLE ABDOMEN - 1 VIEW COMPARISON:  None. FINDINGS: Scattered large and small bowel gas is noted. A nasogastric catheter is coiled within the stomach. No free air is seen. No bony abnormality is noted. IMPRESSION: Nasogastric catheter within stomach. Electronically Signed   By: Alcide CleverMark  Lukens M.D.   On: 10/16/2015 17:28   Medications: I have reviewed the patient's current medications.  Assessment/Plan: 1) Abdominal pain with ascites and segmental dilatation of small bowel-improved on steroids. This seems to be pointing towards the diagnosis of Lupus. I have spoke to Dr. Pollyann SavoyShaili Deveshwar who has agreed to review her  records and se her after discharge. If needed she will refer her to a rheumatologist at [facility].Marland Kitchen 2) Severe malnutrition-will start with liquids and advance her diet as tolerated. She may benefit from a nutrition consult.  3) GERD: On H2 blockers. 4) Renal  insufficiency-needs close monitoring.   LOS: 5 days   Caroline Elliott 10/17/2015, 7:27 PM

## 2015-10-18 LAB — COMPREHENSIVE METABOLIC PANEL
ALBUMIN: 2.2 g/dL — AB (ref 3.5–5.0)
ALK PHOS: 35 U/L — AB (ref 38–126)
ALT: 16 U/L (ref 14–54)
AST: 24 U/L (ref 15–41)
Anion gap: 9 (ref 5–15)
BUN: 71 mg/dL — AB (ref 6–20)
CALCIUM: 8.2 mg/dL — AB (ref 8.9–10.3)
CHLORIDE: 106 mmol/L (ref 101–111)
CO2: 21 mmol/L — AB (ref 22–32)
CREATININE: 3.52 mg/dL — AB (ref 0.44–1.00)
GFR calc Af Amer: 20 mL/min — ABNORMAL LOW (ref >60)
GFR calc non Af Amer: 17 mL/min — ABNORMAL LOW (ref >60)
GLUCOSE: 101 mg/dL — AB (ref 65–99)
Potassium: 5.4 mmol/L — ABNORMAL HIGH (ref 3.5–5.1)
SODIUM: 136 mmol/L (ref 135–145)
Total Bilirubin: 0.5 mg/dL (ref 0.3–1.2)
Total Protein: 5 g/dL — ABNORMAL LOW (ref 6.5–8.1)

## 2015-10-18 LAB — HCV COMMENT:

## 2015-10-18 LAB — LUPUS ANTICOAGULANT PANEL
DRVVT: 31.9 s (ref 0.0–47.0)
PTT Lupus Anticoagulant: 27.1 s (ref 0.0–51.9)

## 2015-10-18 LAB — EXTRACTABLE NUCLEAR ANTIGEN ANTIBODY
SSB (La) (ENA) Antibody, IgG: 3.8 AI — ABNORMAL HIGH (ref 0.0–0.9)
Scleroderma (Scl-70) (ENA) Antibody, IgG: <0.2 AI (ref 0.0–0.9)
ds DNA Ab: >300 IU/mL — ABNORMAL HIGH (ref 0–9)

## 2015-10-18 LAB — CBC
HCT: 34.7 % — ABNORMAL LOW (ref 36.0–46.0)
HEMOGLOBIN: 11.4 g/dL — AB (ref 12.0–15.0)
MCH: 26.1 pg (ref 26.0–34.0)
MCHC: 32.9 g/dL (ref 30.0–36.0)
MCV: 79.4 fL (ref 78.0–100.0)
PLATELETS: 265 10*3/uL (ref 150–400)
RBC: 4.37 MIL/uL (ref 3.87–5.11)
RDW: 14 % (ref 11.5–15.5)
WBC: 4.7 10*3/uL (ref 4.0–10.5)

## 2015-10-18 LAB — HEPATITIS C ANTIBODY (REFLEX): HCV Ab: <0.1 s/co ratio (ref 0.0–0.9)

## 2015-10-18 LAB — MPO/PR-3 (ANCA) ANTIBODIES: Myeloperoxidase Abs: <9 U/mL (ref 0.0–9.0)

## 2015-10-18 LAB — HEPATITIS B SURFACE ANTIGEN: HEP B S AG: NEGATIVE

## 2015-10-18 LAB — ANTI-JO 1 ANTIBODY, IGG: Anti JO-1: <0.2 AI (ref 0.0–0.9)

## 2015-10-18 LAB — RHEUMATOID FACTOR: Rhuematoid fact SerPl-aCnc: <10 IU/mL (ref 0.0–13.9)

## 2015-10-18 MED ORDER — HEPARIN SODIUM (PORCINE) 5000 UNIT/ML IJ SOLN
5000.0000 [IU] | Freq: Three times a day (TID) | INTRAMUSCULAR | Status: DC
  Administered 2015-10-20 – 2015-10-24 (×9): 5000 [IU] via SUBCUTANEOUS
  Filled 2015-10-18 (×13): qty 1

## 2015-10-18 MED ORDER — SODIUM POLYSTYRENE SULFONATE 15 GM/60ML PO SUSP
15.0000 g | Freq: Once | ORAL | Status: AC
  Administered 2015-10-18: 15 g via ORAL
  Filled 2015-10-18: qty 60

## 2015-10-18 NOTE — Progress Notes (Signed)
Pt potassium 5.4 informed on call Dr. Craige CottaKirby.

## 2015-10-18 NOTE — Progress Notes (Signed)
Admit: 10/11/2015 LOS: 6  34F with N/V, abd pain found to have ascites and thickened SB/ascending colon and 4+ protein on UA. Has mild AKI as well.  Subjective:  Pt feeling somewhat better in terms of GI Sx Still worsenign renal function, nonoliguric At length discussed with patient/parents SB Bx normal  07/12 0701 - 07/13 0700 In: 474.2 [P.O.:360; I.V.:64.2; NG/GT:50] Out: 1000 [Urine:1000]  Filed Weights   10/16/15 0500 10/17/15 0621 10/18/15 0514  Weight: 75.6 kg (166 lb 10.7 oz) 75.2 kg (165 lb 12.6 oz) 74.8 kg (164 lb 14.5 oz)    Scheduled Meds: . famotidine (PEPCID) IV  20 mg Intravenous Q24H  . heparin subcutaneous  5,000 Units Subcutaneous Q8H  . methylPREDNISolone (SOLU-MEDROL) injection  40 mg Intravenous Q8H  .  morphine injection  3 mg Intravenous Once  . ondansetron (ZOFRAN) IV  4 mg Intravenous Q8H   Continuous Infusions:   PRN Meds:.acetaminophen, alum & mag hydroxide-simeth, morphine injection, ondansetron (ZOFRAN) IV, phenol, technetium TC 43M mebrofenin  Current Labs: reviewed   RENAL STUDIES UP/C: 0.64 C3 low at 22, C4 3 HBV neg HIV neg RPR neg  ANA: positive 1:1280   Physical Exam:  Blood pressure 106/69, pulse 113, temperature 98.5 F (36.9 C), temperature source Oral, resp. rate 16, height 5\' 4"  (1.626 m), weight 74.8 kg (164 lb 14.5 oz), last menstrual period 09/30/2015, SpO2 99 %. GEN: NAD ENT: NCAT, NGT in place EYES: EOMI CV: RRR PULM: CTAB ABD: s/nt/nd SKIN: no rashes/lesions EXT: Trace LEE b/l  A 1. Proteinuria, UP/C 0.3 -- 0.6; not nephrotic; doesn't explain ascites/bowel wall thickening; 2. 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  No clear evidence of active GN but I am reconsidering smoldering SLE.  I have advised that if SCr further  worsened tomorrow should pursue renal biopsy to exclude SLE invovlement/confirm Dx and potentailly alter therapy. 4. Hypoalbuminemia 5. Ascites, low SAAG  6. Bowel Wall thickening  Plan 1. As above, renal biopsy tomorrow if renal function worsened 2. Try to eval urine sediment today 3. Will need c lose f/u with rheumatology 4. Family updated at bedside 5. Daily weights, Daily Renal Panel, Strict I/Os, Avoid nephrotoxins (NSAIDs, judicious IV Contrast)    Sabra Heckyan Natali Lavallee MD 10/18/2015, 11:00 AM   Recent Labs Lab 10/16/15 0545 10/17/15 1200 10/18/15 0527  NA 133* 134* 136  K 4.5 5.1 5.4*  CL 105 106 106  CO2 21* 21* 21*  GLUCOSE 115* 96 101*  BUN 56* 63* 71*  CREATININE 3.11* 3.22* 3.52*  CALCIUM 8.1* 8.1* 8.2*    Recent Labs Lab 10/13/15 0545 10/15/15 0506 10/15/15 1822 10/18/15 0527  WBC 3.8* 2.6* 3.3* 4.7  NEUTROABS 2.9 1.6* 2.5  --   HGB 12.0 11.9* 14.5 11.4*  HCT 36.2 34.8* 43.3 34.7*  MCV 80.4 80.0 80.3 79.4  PLT 178 252 250 265

## 2015-10-18 NOTE — Progress Notes (Signed)
Subjective: ABM is tight.  No flatus or any bowel movements.  Objective: Vital signs in last 24 hours: Temp:  [97.9 F (36.6 C)-98.8 F (37.1 C)] 97.9 F (36.6 C) (07/13 1441) Pulse Rate:  [110-113] 112 (07/13 1441) Resp:  [16-18] 16 (07/13 1441) BP: (106-112)/(66-74) 112/74 mmHg (07/13 1441) SpO2:  [99 %] 99 % (07/13 0514) Weight:  [74.8 kg (164 lb 14.5 oz)] 74.8 kg (164 lb 14.5 oz) (07/13 0514) Last BM Date: 10/11/15  Intake/Output from previous day: 07/12 0701 - 07/13 0700 In: 474.2 [P.O.:360; I.V.:64.2; NG/GT:50] Out: 1000 [Urine:1000] Intake/Output this shift: Total I/O In: 50 [IV Piggyback:50] Out: -   General appearance: alert and uncomfortable GI: soft, distended, tympanic, minimal bowel sounds  Lab Results:  Recent Labs  10/15/15 1822 10/18/15 0527  WBC 3.3* 4.7  HGB 14.5 11.4*  HCT 43.3 34.7*  PLT 250 265   BMET  Recent Labs  10/16/15 0545 10/17/15 1200 10/18/15 0527  NA 133* 134* 136  K 4.5 5.1 5.4*  CL 105 106 106  CO2 21* 21* 21*  GLUCOSE 115* 96 101*  BUN 56* 63* 71*  CREATININE 3.11* 3.22* 3.52*  CALCIUM 8.1* 8.1* 8.2*   LFT  Recent Labs  10/18/15 0527  PROT 5.0*  ALBUMIN 2.2*  AST 24  ALT 16  ALKPHOS 35*  BILITOT 0.5   PT/INR No results for input(s): LABPROT, INR in the last 72 hours. Hepatitis Panel  Recent Labs  10/16/15 1950  HEPBSAG Negative   C-Diff No results for input(s): CDIFFTOX in the last 72 hours. Fecal Lactopherrin No results for input(s): FECLLACTOFRN in the last 72 hours.  Studies/Results: Nm Hepatobiliary Liver Func  10/17/2015  CLINICAL DATA:  25 year old female with history of right upper quadrant abdominal pain for 1 week. Nausea and vomiting. Pain worsens with fatty meals. EXAM: NUCLEAR MEDICINE HEPATOBILIARY IMAGING TECHNIQUE: Sequential images of the abdomen were obtained out to 60 minutes following intravenous administration of radiopharmaceutical. RADIOPHARMACEUTICALS:  5.2 mCi Tc-467m  Choletec  IV COMPARISON:  No priors.  CT the abdomen and pelvis 10/11/2015. FINDINGS: Prompt uptake and biliary excretion of activity by the liver is seen. Gallbladder activity is not visualized at any point during the examination, including after administration of 3 mg of IV morphine. Biliary activity passes into small bowel, consistent with patent common bile duct. IMPRESSION: 1. Nonvisualization of the gallbladder suggestive of cystic duct obstruction. This could indicate acute cholecystitis in the appropriate clinical setting (particularly in light of extensive biliary sludge noted in the gallbladder on prior ultrasound 10/15/2015); however, there multiple causes of false positivity on these examinations, including prolonged fasting. Clinical correlation is recommended. Electronically Signed   By: Trudie Reedaniel  Entrikin M.D.   On: 10/17/2015 10:05   Dg Abd Portable 1v  10/16/2015  CLINICAL DATA:  Check nasogastric catheter placement EXAM: PORTABLE ABDOMEN - 1 VIEW COMPARISON:  None. FINDINGS: Scattered large and small bowel gas is noted. A nasogastric catheter is coiled within the stomach. No free air is seen. No bony abnormality is noted. IMPRESSION: Nasogastric catheter within stomach. Electronically Signed   By: Alcide CleverMark  Lukens M.D.   On: 10/16/2015 17:28    Medications:  Scheduled: . famotidine (PEPCID) IV  20 mg Intravenous Q24H  . [START ON 10/20/2015] heparin subcutaneous  5,000 Units Subcutaneous Q8H  . methylPREDNISolone (SOLU-MEDROL) injection  40 mg Intravenous Q8H  .  morphine injection  3 mg Intravenous Once  . ondansetron (ZOFRAN) IV  4 mg Intravenous Q8H   Continuous:  Assessment/Plan: 1) Ileus. 2) ? Enteritis. 3) AKI.   She gained a significant amount of benefit with the enteroscopy when the fluid was aspirated.  She did not feel that the NG tube was of a benefit, however, I think it was too soon to remove the tube.  She continues to have an ileus.  I counseled her about replacing the tube but  she does not want the tube at this time.  No overt benefit with steroids currently.  If she does not improve from the GI standpoint in the next 1-3 days, I think she needs to be transferred to a tertiary care center for further evaluation.  Renal is planning for a kidney biopsy tomorrow.  Plan: 1) NPO. 2) I asked her to consider replacement of the NG tube.    LOS: 6 days   Laritza Vokes D 10/18/2015, 4:13 PM

## 2015-10-18 NOTE — Consult Note (Signed)
Chief Complaint: Patient was seen in consultation today for random renal biopsy at the request of Dr Sabra Heck  Referring Physician(s): Dr Sabra Heck  Supervising Physician: Gilmer Mor  Patient Status: Inpatient  History of Present Illness: Caroline Elliott is a 25 y.o. female   N/V abd bloating abd epigastric pain Presented to ED 10/09/2015 sxs x several months  Work up revealed ascites  Paracentesis 2.2 L serous fluid 7/7 Findings consistent with nephrotic syndrome Nephrology consulted Feels not Nephrotic syndrome + proteinuria +ANA and hypocomplementemia; may be Lupus Worsening Cr Request for random renal biopsy Will check Cr in am per Dr Jenita Seashore better may not do biopsy  Past Medical History  Diagnosis Date  . Seasonal allergies   . Ascites 10/11/2015 hospitalized    Past Surgical History  Procedure Laterality Date  . No past surgeries    . Enteroscopy N/A 10/16/2015    Procedure: ENTEROSCOPY;  Surgeon: Jeani Hawking, MD;  Location: [facility] ENDOSCOPY;  Service: Endoscopy;  Laterality: N/A;    Allergies: Penicillins and Omeprazole magnesium  Medications: Prior to Admission medications   Medication Sig Start Date End Date Taking? Authorizing Provider  cetirizine (ZYRTEC) 10 MG tablet Take 10 mg by mouth daily as needed for allergies.    Yes Historical Provider, MD  ondansetron (ZOFRAN ODT) 4 MG disintegrating tablet Take 1 tablet (4 mg total) by mouth every 8 (eight) hours as needed for nausea or vomiting. 10/09/15   Samantha Tripp Dowless, PA-C  ranitidine (ZANTAC) 150 MG tablet Take 1 tablet (150 mg total) by mouth 2 (two) times daily. 10/09/15   Samantha Tripp Dowless, PA-C  sucralfate (CARAFATE) 1 GM/10ML suspension Take 10 mLs (1 g total) by mouth 4 (four) times daily -  with meals and at bedtime. 10/09/15   Samantha Tripp Dowless, PA-C     Family History  Problem Relation Age of Onset  . Hypertension Father     Social History   Social History    . Marital Status: Single    Spouse Name: engaged-Dominique  . Number of Children: 0  . Years of Education: college   Occupational History  . Childcare-afternoon teacher    Social History Main Topics  . Smoking status: Never Smoker   . Smokeless tobacco: Never Used  . Alcohol Use: 0.0 oz/week    0 Standard drinks or equivalent per week     Comment: 10/11/2015 "rarely I'll have wine"  . Drug Use: No  . Sexual Activity:    Partners: Male    Birth Control/ Protection: Coitus interruptus   Other Topics Concern  . 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   Lives with her fiance.   Her family lives in [place].    Review of Systems: A 12 point ROS discussed and pertinent positives are indicated in the HPI above.  All other systems are negative.  Review of Systems  Constitutional: Positive for activity change, appetite change, fatigue and unexpected weight change. Negative for fever.  Respiratory: Negative for shortness of breath.   Gastrointestinal: Positive for nausea and vomiting.  Neurological: Positive for weakness.  Psychiatric/Behavioral: Negative for behavioral problems and confusion.    Vital Signs: BP 112/74 mmHg  Pulse 112  Temp(Src) 97.9 F (36.6 C) (Oral)  Resp 16  Ht  (1.626 m)  Wt 164 lb 14.5 oz (74.8 kg)  BMI 28.29 kg/m2  SpO2 99%  LMP 09/30/2015  Physical Exam  Constitutional: She is oriented to person,  place, and time.  Cardiovascular: Normal rate, regular rhythm and normal heart sounds.   Pulmonary/Chest: Effort normal and breath sounds normal.  Abdominal: Soft. Bowel sounds are normal. She exhibits distension. There is tenderness.  Musculoskeletal: Normal range of motion.  Neurological: She is alert and oriented to person, place, and time.  Skin: Skin is warm and dry.  Psychiatric: She has a normal mood and affect. Her behavior is normal. Judgment and thought content normal.  Nursing note and vitals  reviewed.   Mallampati Score:  MD Evaluation Airway: WNL Heart: WNL Abdomen: WNL Abdomen comments: ABM pain Chest/ Lungs: WNL ASA  Classification: 3 Mallampati/Airway Score: One  Imaging: Nm Hepatobiliary Liver Func  10/17/2015  CLINICAL DATA:  25 year old female with history of right upper quadrant abdominal pain for 1 week. Nausea and vomiting. Pain worsens with fatty meals. EXAM: NUCLEAR MEDICINE HEPATOBILIARY IMAGING TECHNIQUE: Sequential images of the abdomen were obtained out to 60 minutes following intravenous administration of radiopharmaceutical. RADIOPHARMACEUTICALS:  5.2 mCi Tc-46m  Choletec IV COMPARISON:  No priors.  CT the abdomen and pelvis 10/11/2015. FINDINGS: Prompt uptake and biliary excretion of activity by the liver is seen. Gallbladder activity is not visualized at any point during the examination, including after administration of 3 mg of IV morphine. Biliary activity passes into small bowel, consistent with patent common bile duct. IMPRESSION: 1. Nonvisualization of the gallbladder suggestive of cystic duct obstruction. This could indicate acute cholecystitis in the appropriate clinical setting (particularly in light of extensive biliary sludge noted in the gallbladder on prior ultrasound 10/15/2015); however, there multiple causes of false positivity on these examinations, including prolonged fasting. Clinical correlation is recommended. Electronically Signed   By: Trudie Reed M.D.   On: 10/17/2015 10:05   Ct Abdomen Pelvis W Contrast  10/11/2015  ADDENDUM REPORT: 10/11/2015 18:11 ADDENDUM: These results were called by telephone at the time of interpretation on 10/11/2015 at 5:50 pm to Dr. Charna Elizabeth , who verbally acknowledged these results. Although the the patient's young age would favor a benign etiology for the findings, it should be noted that the degree of abdominopelvic ascites is unusual for infectious/inflammatory enteritis. However, pertinent negatives on  this study include normal bilateral ovaries, visualized appendix, and pancreas. Consider upper endoscopy for further evaluation, as clinically warranted. Electronically Signed   By: Charline Bills M.D.   On: 10/11/2015 18:11  10/11/2015  CLINICAL DATA:  Upper abdominal pain, nausea/vomiting EXAM: CT ABDOMEN AND PELVIS WITH CONTRAST TECHNIQUE: Multidetector CT imaging of the abdomen and pelvis was performed using the standard protocol following bolus administration of intravenous contrast. CONTRAST:  ISOVUE-300 IOPAMIDOL (ISOVUE-300) INJECTION 61% COMPARISON:  None. FINDINGS: Lower chest: 4 mm subpleural nodule in the left lower lobe (series 4/image 5), likely benign. No dedicated follow-up imaging is required given the patient's age. Hepatobiliary: Liver is within normal limits. Gallbladder is unremarkable. No intrahepatic or extrahepatic ductal dilatation. Pancreas: Within normal limits. Spleen: 2.7 cm loculated fluid density lesion along the lateral spleen (series 3/ image 26), possibly reflecting a pseudocyst versus loculated ascites. Adrenals/Urinary Tract: Adrenal glands are within normal limits. Kidneys are within normal limits.  No hydronephrosis. Bladder is underdistended but unremarkable. Stomach/Bowel: Stomach is within normal limits. Wall thickening involving a long segments of small bowel in the mid abdomen (series 3/ images 39 and 49). Additional wall thickening involving ascending colon/cecum (coronal image 54). This appearance suggests infectious or inflammatory enterocolitis. Appendix is within normal limits (series 3/ image 64). Vascular/Lymphatic: No evidence of abdominal aortic  aneurysm. No suspicious abdominopelvic lymphadenopathy. Reproductive: Uterus is within normal limits. Bilateral ovaries are within normal limits. Other: Large volume abdominopelvic ascites, mostly measuring simple fluid density, although possibly minimally complicated by hemorrhage in the dependent pelvis (series  3/image 78). Associated mild body wall edema. Musculoskeletal: Visualized osseous structures are within normal limits. IMPRESSION: Long segment small bowel wall thickening in the right mid abdomen. Additional wall thickening involving the ascending colon/cecum. This appearance suggests infectious or inflammatory enterocolitis. Large volume abdominopelvic ascites, possibly minimally complicated by hemorrhage. Additional ancillary findings as above. These results will be called to the ordering clinician or representative by the Radiology Department at the imaging location. Electronically Signed: By: Charline Bills M.D. On: 10/11/2015 17:33   US Paracentesis  10/12/2015  INDICATION: Several months of epigastric abdominal pain with recent CT scan revealing a a moderate amount of abdominal ascites. Patient was referred to the hospital for admission and request made for diagnostic and therapeutic paracentesis. EXAM: ULTRASOUND GUIDED DIAGNOSTIC AND THERAPEUTIC PARACENTESIS MEDICATIONS: 1% lidocaine COMPLICATIONS: None immediate. PROCEDURE: Informed written consent was obtained from the patient after a discussion of the risks, benefits and alternatives to treatment. A timeout was performed prior to the initiation of the procedure. Initial ultrasound scanning demonstrates a moderate amount of ascites within the right lower abdominal quadrant. The right lower abdomen was prepped and draped in the usual sterile fashion. 1% lidocaine was used for local anesthesia. Following this, a 19 gauge, 7-cm, Yueh catheter was introduced. An ultrasound image was saved for documentation purposes. The paracentesis was performed. The catheter was removed and a dressing was applied. The patient tolerated the procedure well without immediate post procedural complication. FINDINGS: A total of approximately 2.2 L of light serosanguineous fluid was removed. Samples were sent to the laboratory as requested by the clinical team. IMPRESSION:  Successful ultrasound-guided paracentesis yielding 2.2 liters of peritoneal fluid. Read by: Barnetta Chapel, PA-C Electronically Signed   By: Jolaine Click M.D.   On: 10/12/2015 11:10   Ir US Guide Vasc Access Right  10/11/2015  INDICATION: 25 year old female with abdominal pain, and dehydration. She requires a CT scan of the abdomen and pelvis with intravenous contrast material button IV cannot be obtained secondary to dehydration. Ultrasound-guided IV start is requested. EXAM: IR ULTRASOUND GUIDANCE VASC ACCESS RIGHT MEDICATIONS: None ANESTHESIA/SEDATION: None FLUOROSCOPY TIME:  None COMPLICATIONS: None immediate. PROCEDURE: The right arm was sterilely prepped and draped in standard fashion using chlorhexidine skin prep. A tourniquet was applied. Using sterile technique and ultrasound guidance, the right brachial vein was punctured in the mid upper arm with a 21 gauge micropuncture needle. The needle was exchanged over a micro wire for a 5 [place] transitional micro sheath. The sheath was advanced in the brachial vein. The sheath was fitted with a valves cap and flushed with sterile saline. The patient tolerated the procedure well. IMPRESSION: Successful ultrasound-guided IV start. Electronically Signed   By: Malachy Moan M.D.   On: 10/11/2015 17:17   Dg Abd 2 Views  10/14/2015  CLINICAL DATA:  Abdominal distention 4 days EXAM: ABDOMEN - 2 VIEW COMPARISON:  CT 10/11/2015 FINDINGS: No free air beneath hemidiaphragms. Dilated loops of small bowel up to 3.5 cm similar to comparison CT. There is gas throughout the small bowel and colon including the rectum. No pathologic calcifications IMPRESSION: 1. No intraperitoneal free air or evidence of high-grade bowel obstruction. 2. Dilated loops of small bowel in the RIGHT abdomen similar to comparison CT consistent with small bowel edema. Electronically  Signed   By: Genevive BiStewart  Edmunds M.D.   On: 10/14/2015 12:46   Dg Abd Portable 1v  10/16/2015  CLINICAL DATA:  Check  nasogastric catheter placement EXAM: PORTABLE ABDOMEN - 1 VIEW COMPARISON:  None. FINDINGS: Scattered large and small bowel gas is noted. A nasogastric catheter is coiled within the stomach. No free air is seen. No bony abnormality is noted. IMPRESSION: Nasogastric catheter within stomach. Electronically Signed   By: Alcide CleverMark  Lukens M.D.   On: 10/16/2015 17:28   Dg Abd Portable 1v  10/15/2015  CLINICAL DATA:  Abdominal distension, tight sensation EXAM: PORTABLE ABDOMEN - 1 VIEW COMPARISON:  CT abdomen 10/11/2015, abdominal x-ray 10/14/2015 FINDINGS: There are multiple distended loops of small bowel. No significant colonic air is identified. There is no evidence of pneumoperitoneum, portal venous gas or pneumatosis. There are no pathologic calcifications along the expected course of the ureters. The osseous structures are unremarkable. IMPRESSION: Gaseous distention of small bowel without significant colonic air. Differential considerations include an ileus versus small-bowel obstruction. Electronically Signed   By: Elige KoHetal  Patel   On: 10/15/2015 19:05   [place] Abdomen Limited Ruq  10/15/2015  CLINICAL DATA:  Nausea and vomiting. Patient admitted 10/11/2015 with extensive bowel wall thickening, ascites and proteinuria. EXAM: US ABDOMEN LIMITED - RIGHT UPPER QUADRANT COMPARISON:  CT abdomen and pelvis 10/11/2015 P FINDINGS: Gallbladder: The gallbladder is filled with sludge. No stones or wall thickening are identified. Sonographer reports negative Murphy's sign. Ascites is noted. Common bile duct: Diameter: 0.4 cm Liver: No focal lesion identified. Within normal limits in parenchymal echogenicity. IMPRESSION: The gallbladder is filled with sludge. No evidence of cholecystitis. Ascites. Electronically Signed   By: Drusilla Kannerhomas  Dalessio M.D.   On: 10/15/2015 16:13    Labs:  CBC:  Recent Labs  10/13/15 0545 10/15/15 0506 10/15/15 1822 10/18/15 0527  WBC 3.8* 2.6* 3.3* 4.7  HGB 12.0 11.9* 14.5 11.4*  HCT 36.2  34.8* 43.3 34.7*  PLT 178 252 250 265    COAGS:  Recent Labs  10/14/15 0440  INR 1.24  APTT 26    BMP:  Recent Labs  10/15/15 1822 10/16/15 0545 10/17/15 1200 10/18/15 0527  NA 133* 133* 134* 136  K 4.9 4.5 5.1 5.4*  CL 105 105 106 106  CO2 21* 21* 21* 21*  GLUCOSE 115* 115* 96 101*  BUN 56* 56* 63* 71*  CALCIUM 7.8* 8.1* 8.1* 8.2*  CREATININE 2.86* 3.11* 3.22* 3.52*  GFRNONAA 22* 20* 19* 17*  GFRAA 26* 23* 22* 20*    LIVER FUNCTION TESTS:  Recent Labs  10/15/15 0506 10/16/15 0545 10/17/15 1200 10/18/15 0527  BILITOT 0.5 0.5 0.8 0.5  AST 19 24 22 24   ALT 10* 13* 13* 16  ALKPHOS 28* 32* 34* 35*  PROT 4.5* 5.5* 5.1* 5.0*  ALBUMIN 2.2* 3.0* 2.5* 2.2*    TUMOR MARKERS: No results for input(s): AFPTM, CEA, CA199, CHROMGRNA in the last 8760 hours.  Assessment and Plan:  N/V Ascites +ANA and hypocomplementemia Poss Lupus Scheduled for random renal 7/14 bx per Nephrology (if Cr better---may hold off) Risks and Benefits discussed with the patient including, but not limited to bleeding, infection, damage to adjacent structures or low yield requiring additional tests. All of the patient's questions were answered, patient is agreeable to proceed. Consent signed and in chart.  Thank you for this interesting consult.  I greatly enjoyed meeting Charleston PootMichelle N Raulston and look forward to participating in their care.  A copy of this report was  sent to the requesting provider on this date.  Electronically Signed: Ayodeji Keimig A 10/18/2015, 3:35 PM   I spent a total of 40 Minutes    in face to face in clinical consultation, greater than 50% of which was counseling/coordinating care for random renal bx

## 2015-10-18 NOTE — Progress Notes (Signed)
Nutrition Follow-up  DOCUMENTATION CODES:   Not applicable  INTERVENTION:   -Boost Breeze po TID, each supplement provides 250 kcal and 9 grams of protein -If prolonged NPO/clear liquid status is anticipated, consider initiation of nutrition support  NUTRITION DIAGNOSIS:   Inadequate oral intake related to altered GI function as evidenced by NPO status.  Ongoing  GOAL:   Patient will meet greater than or equal to 90% of their needs  Unmet  MONITOR:   PO intake, Supplement acceptance, Diet advancement, Labs, Weight trends, Skin, I & O's  REASON FOR ASSESSMENT:   Malnutrition Screening Tool    ASSESSMENT:   Caroline Elliott is a 25 y.o. female with medical history significant of GERD and allergies; who presents with complaints of abdominal pain with nausea and vomiting. Symptoms have been intermittent and initially started in 04/2015. Abdominal   Pt and family sleeping soundly at time of visit.   Pt has not received adequate nutrition since hospitalization. Noted pt has been NPO/clear liquids x 6 days. Will add Boost Breeze supplement in attempt to increase calorie and protein intake. May need to consider nutrition support if prolonged NPO/clear liquid status is anticipated. Meal completion 0%.   Pt underwent small bowel enteroscopy on 10/16/15; biopsy of jejunum was normal per MD notes.   Per MD notes, nephrology was consulted due to potential nephrotic syndrome. Per nephrology, pt with potential for lupus, awaiting rheumatology evaluation (which is not available inpatient). Renal function continues to worsen, plan for potential renal biopsy on 10/19/15.   Labs reviewed: K: 5.4.   Diet Order:  Diet clear liquid Room service appropriate?: Yes; Fluid consistency:: Thin  Skin:  Reviewed, no issues  Last BM:  10/15/15  Height:   Ht Readings from Last 1 Encounters:  10/11/15 5\' 4"  (1.626 m)    Weight:   Wt Readings from Last 1 Encounters:  10/18/15 164 lb 14.5 oz  (74.8 kg)    Ideal Body Weight:  54.5 kg  BMI:  Body mass index is 28.29 kg/(m^2).  Estimated Nutritional Needs:   Kcal:  1500-1700  Protein:  75-90 grams  Fluid:  1.5-1.7 L  EDUCATION NEEDS:   No education needs identified at this time  Caroline Scibilia A. Mayford Elliott, RD, LDN, CDE Pager: [phone] After hours Pager: [phone]

## 2015-10-18 NOTE — Progress Notes (Signed)
[facility]  PROGRESS NOTE  Caroline Elliott UJW:[id number] DOB: September 26, 1990 DOA: 10/11/2015 PCP: Cain Saupe, MD    Brief HPI:  Pt. with PMH of GERD; admitted on 10/11/2015, with complaint of epigastric pain, was found to have enterocolitis as well as ascites. Patient underwent paracentesis and the findings were consistent with possible nephrotic syndrome, nephrology was consulted.  Principal Problem:   Ascites Active Problems:   GERD (gastroesophageal reflux disease)   Abdominal pain, epigastric   Enterocolitis   Nausea and vomiting   Lymphadenopathy   Acute kidney injury (HCC)   Abdominal distension   Assessment/Plan: 1. Acute kidney injury/hypoalbuminemia/ascites- patient came with epigastric abdominal pain, CT of the abdomen was positive for ascites as well as small bowel and ascending colon thickening. Patient underwent paracentesis and fluid analysis was consistent with nephrotic syndrome. Nephrology consulted and deemed that it is not nephrotic syndrome. 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 Dose of Solumedrol changed to 40 mg IV q 8 hr. Plan for renal biopsy in am. 2. ? Enterocolitis - CT scan abdomen was positive for small bowel wall thickening, gastroenterology was consulted, antibiotics were discontinued. Patient underwent enteroscopy and biopsy, patient found to have ileus so NG tube tube was inserted. NG tube now has been discontinued. Biopsy shows normal jejunal mucosa. 3. Hyperkalemia- potassium is 5.4 today, will give one dose of kayexalate 15 gm po x 1. Follow bmp in am.   DVT prophylaxis: Heparin Code Status: Full code Family Communication: Discussed with mother at bedside Disposition Plan: Pending improvement in symptoms   Consultants:  GI  Procedures:  Small bowel enteroscopy  Antibiotics:  None   Subjective: Patient seen and examined, NG tube was removed yesterday. Renal function continue to get  worse.  Objective: Filed Vitals:   10/17/15 0934 10/17/15 1500 10/17/15 2100 10/18/15 0514  BP: 102/61 95/64 109/66 106/69  Pulse: 117 105 110 113  Temp: 98.1 F (36.7 C) 98.4 F (36.9 C) 98.8 F (37.1 C) 98.5 F (36.9 C)  TempSrc: Oral Oral Oral   Resp: Height:      Weight:    74.8 kg (164 lb 14.5 oz)  SpO2: 99% 100% 99% 99%    Intake/Output Summary (Last 24 hours) at 10/18/15 1401 Last data filed at 10/18/15 0806  Gross per 24 hour  Intake 524.17 ml  Output   1000 ml  Net -475.83 ml   Filed Weights   10/16/15 0500 10/17/15 0621 10/18/15 0514  Weight: 75.6 kg (166 lb 10.7 oz) 75.2 kg (165 lb 12.6 oz) 74.8 kg (164 lb 14.5 oz)    Examination:  General exam: Appears calm and comfortable  Respiratory system: Clear to auscultation. Respiratory effort normal. Cardiovascular system: S1 & S2 heard, RRR. No JVD, murmurs, rubs, gallops or clicks. No pedal edema. Gastrointestinal system: Abdomen is nondistended, soft and nontender. No organomegaly or masses felt. Normal bowel sounds heard. Central nervous system: Alert and oriented. No focal neurological deficits. Extremities: Symmetric 5 x 5 power. Skin: No rashes, lesions or ulcers Psychiatry: Judgement and insight appear normal. Mood & affect appropriate.    Data Reviewed: I have personally reviewed following labs and imaging studies Basic Metabolic Panel:  Recent Labs Lab 10/11/15 2121  10/13/15 0545  10/15/15 0506 10/15/15 1822 10/16/15 0545 10/17/15 1200 10/18/15 0527  NA 137  < > 135  < > 134* 133* 133* 134* 136  K 4.1  < > 3.8  < > 4.0  4.9 4.5 5.1 5.4*  CL 104  < > 107  < > 105 105 105 106 106  CO2 27  < > 24  < > 21* 21* 21* 21* 21*  GLUCOSE 103*  < > 90  < > 96 115* 115* 96 101*  BUN 34*  < > 36*  < > 53* 56* 56* 63* 71*  CREATININE 1.51*  < > 1.55*  < > 2.75* 2.86* 3.11* 3.22* 3.52*  CALCIUM 8.4*  < > 7.9*  < > 7.8* 7.8* 8.1* 8.1* 8.2*  MG 1.8  --  1.9  --   --   --  2.3  --   --   < > =  values in this interval not displayed. Liver Function Tests:  Recent Labs Lab 10/13/15 0545 10/15/15 0506 10/16/15 0545 10/17/15 1200 10/18/15 0527  AST 16 19 24 22 24   ALT 7* 10* 13* 13* 16  ALKPHOS 32* 28* 32* 34* 35*  BILITOT 0.5 0.5 0.5 0.8 0.5  PROT 4.2* 4.5* 5.5* 5.1* 5.0*  ALBUMIN 2.0* 2.2* 3.0* 2.5* 2.2*    Recent Labs Lab 10/15/15 0506  LIPASE 20   No results for input(s): AMMONIA in the last 168 hours. CBC:  Recent Labs Lab 10/11/15 2121 10/12/15 0541 10/13/15 0545 10/15/15 0506 10/15/15 1822 10/18/15 0527  WBC 5.0 4.8 3.8* 2.6* 3.3* 4.7  NEUTROABS 3.7  --  2.9 1.6* 2.5  --   HGB 13.5 12.5 12.0 11.9* 14.5 11.4*  HCT 40.1 36.7 36.2 34.8* 43.3 34.7*  MCV 80.2 79.8 80.4 80.0 80.3 79.4  PLT 208 255 178 252 250 265   Cardiac Enzymes: No results for input(s): CKTOTAL, CKMB, CKMBINDEX, TROPONINI in the last 168 hours. BNP (last 3 results) No results for input(s): BNP in the last 8760 hours.  ProBNP (last 3 results) No results for input(s): PROBNP in the last 8760 hours.  CBG:  Recent Labs Lab 10/15/15 1608  GLUCAP 119*    Recent Results (from the past 240 hour(s))  Culture, body fluid-bottle     Status: None   Collection Time: 10/12/15 10:47 AM  Result Value Ref Range Status   Specimen Description FLUID PERITONEAL  Final   Special Requests NONE  Final   Culture NO GROWTH 5 DAYS  Final   Report Status 10/17/2015 FINAL  Final  Gram stain     Status: None   Collection Time: 10/12/15 10:47 AM  Result Value Ref Range Status   Specimen Description FLUID PERITONEAL  Final   Special Requests NONE  Final   Gram Stain   Final    RARE WBC PRESENT,BOTH PMN AND MONONUCLEAR NO ORGANISMS SEEN    Report Status 10/12/2015 FINAL  Final  Surgical pcr screen     Status: None   Collection Time: 10/16/15 11:26 AM  Result Value Ref Range Status   MRSA, PCR NEGATIVE NEGATIVE Final   Staphylococcus aureus NEGATIVE NEGATIVE Final    Comment:        The Xpert  SA Assay (FDA approved for NASAL specimens in patients over 24 years of age), is one component of a comprehensive surveillance program.  Test performance has been validated by [facility] for patients greater than or equal to 24 year old. It is not intended to diagnose infection nor to guide or monitor treatment.      Studies: Nm Hepatobiliary Liver Func  10/17/2015  CLINICAL DATA:  25 year old female with history of right upper quadrant abdominal pain for 1 week.  Nausea and vomiting. Pain worsens with fatty meals. EXAM: NUCLEAR MEDICINE HEPATOBILIARY IMAGING TECHNIQUE: Sequential images of the abdomen were obtained out to 60 minutes following intravenous administration of radiopharmaceutical. RADIOPHARMACEUTICALS:  5.2 mCi Tc-38m  Choletec IV COMPARISON:  No priors.  CT the abdomen and pelvis 10/11/2015. FINDINGS: Prompt uptake and biliary excretion of activity by the liver is seen. Gallbladder activity is not visualized at any point during the examination, including after administration of 3 mg of IV morphine. Biliary activity passes into small bowel, consistent with patent common bile duct. IMPRESSION: 1. Nonvisualization of the gallbladder suggestive of cystic duct obstruction. This could indicate acute cholecystitis in the appropriate clinical setting (particularly in light of extensive biliary sludge noted in the gallbladder on prior ultrasound 10/15/2015); however, there multiple causes of false positivity on these examinations, including prolonged fasting. Clinical correlation is recommended. Electronically Signed   By: Trudie Reed M.D.   On: 10/17/2015 10:05   Dg Abd Portable 1v  10/16/2015  CLINICAL DATA:  Check nasogastric catheter placement EXAM: PORTABLE ABDOMEN - 1 VIEW COMPARISON:  None. FINDINGS: Scattered large and small bowel gas is noted. A nasogastric catheter is coiled within the stomach. No free air is seen. No bony abnormality is noted. IMPRESSION: Nasogastric catheter  within stomach. Electronically Signed   By: Alcide Clever M.D.   On: 10/16/2015 17:28    Scheduled Meds: . famotidine (PEPCID) IV  20 mg Intravenous Q24H  . heparin subcutaneous  5,000 Units Subcutaneous Q8H  . methylPREDNISolone (SOLU-MEDROL) injection  40 mg Intravenous Q8H  .  morphine injection  3 mg Intravenous Once  . ondansetron (ZOFRAN) IV  4 mg Intravenous Q8H   Continuous Infusions:    Time spent: 25 min    [facility]  [facility] Pager [phone]. If 7PM-7AM, please contact night-coverage at [email or web address], Office  [phone]  password [email or web address] 10/18/2015, 2:01 PM  LOS: 6 days

## 2015-10-19 ENCOUNTER — Inpatient Hospital Stay (HOSPITAL_COMMUNITY): Payer: Commercial Managed Care - HMO

## 2015-10-19 LAB — PROTIME-INR
INR: 1.48 (ref 0.00–1.49)
PROTHROMBIN TIME: 18 s — AB (ref 11.6–15.2)

## 2015-10-19 LAB — BASIC METABOLIC PANEL
Anion gap: 11 (ref 5–15)
BUN: 84 mg/dL — ABNORMAL HIGH (ref 6–20)
CALCIUM: 8 mg/dL — AB (ref 8.9–10.3)
CO2: 21 mmol/L — ABNORMAL LOW (ref 22–32)
CREATININE: 3.67 mg/dL — AB (ref 0.44–1.00)
Chloride: 102 mmol/L (ref 101–111)
GFR calc non Af Amer: 16 mL/min — ABNORMAL LOW (ref >60)
GFR, EST AFRICAN AMERICAN: 19 mL/min — AB (ref >60)
Glucose, Bld: 106 mg/dL — ABNORMAL HIGH (ref 65–99)
Potassium: 5 mmol/L (ref 3.5–5.1)
SODIUM: 134 mmol/L — AB (ref 135–145)

## 2015-10-19 LAB — CBC
HCT: 28.5 % — ABNORMAL LOW (ref 36.0–46.0)
Hemoglobin: 9.7 g/dL — ABNORMAL LOW (ref 12.0–15.0)
MCH: 26.8 pg (ref 26.0–34.0)
MCHC: 34 g/dL (ref 30.0–36.0)
MCV: 78.7 fL (ref 78.0–100.0)
PLATELETS: 236 10*3/uL (ref 150–400)
RBC: 3.62 MIL/uL — AB (ref 3.87–5.11)
RDW: 14.2 % (ref 11.5–15.5)
WBC: 5.9 10*3/uL (ref 4.0–10.5)

## 2015-10-19 LAB — ANCA TITERS
Atypical P-ANCA titer: <1:20 {titer}
C-ANCA: <1:20 {titer}
P-ANCA: <1:20 {titer}

## 2015-10-19 LAB — APTT: APTT: 25 s (ref 24–37)

## 2015-10-19 MED ORDER — MIDAZOLAM HCL 2 MG/2ML IJ SOLN
INTRAMUSCULAR | Status: AC
Start: 2015-10-19 — End: 2015-10-19
  Filled 2015-10-19: qty 2

## 2015-10-19 MED ORDER — LIDOCAINE HCL 1 % IJ SOLN
INTRAMUSCULAR | Status: AC
Start: 2015-10-19 — End: 2015-10-19
  Filled 2015-10-19: qty 20

## 2015-10-19 MED ORDER — GELATIN ABSORBABLE 12-7 MM EX MISC
CUTANEOUS | Status: AC
  Filled 2015-10-19: qty 1

## 2015-10-19 MED ORDER — FENTANYL CITRATE (PF) 100 MCG/2ML IJ SOLN
INTRAMUSCULAR | Status: AC | PRN
  Administered 2015-10-19: 50 ug via INTRAVENOUS

## 2015-10-19 MED ORDER — MIDAZOLAM HCL 2 MG/2ML IJ SOLN
INTRAMUSCULAR | Status: AC | PRN
  Administered 2015-10-19: 1 mg via INTRAVENOUS

## 2015-10-19 MED ORDER — FENTANYL CITRATE (PF) 100 MCG/2ML IJ SOLN
INTRAMUSCULAR | Status: AC
  Filled 2015-10-19: qty 2

## 2015-10-19 NOTE — Progress Notes (Signed)
Subjective: No complaints.  Feeling better as she was able to pass flatus yesterday and some bowel movements today.  Objective: Vital signs in last 24 hours: Temp:  [97.9 F (36.6 C)-98.6 F (37 C)] 98.6 F (37 C) (07/14 0457) Pulse Rate:  [95-112] 98 (07/14 0457) Resp:  [16-18] 18 (07/14 0457) BP: (103-112)/(64-74) 108/64 mmHg (07/14 0457) SpO2:  [98 %-100 %] 98 % (07/14 0457) Weight:  [75.3 kg (166 lb 0.1 oz)] 75.3 kg (166 lb 0.1 oz) (07/14 0457) Last BM Date: 10/11/15  Intake/Output from previous day: 07/13 0701 - 07/14 0700 In: 100 [IV Piggyback:100] Out: -  Intake/Output this shift:    General appearance: alert and no distress GI: soft, less distended, less tympanic.  Lab Results:  Recent Labs  10/18/15 0527 10/19/15 0446  WBC 4.7 5.9  HGB 11.4* 9.7*  HCT 34.7* 28.5*  PLT 265 236   BMET  Recent Labs  10/17/15 1200 10/18/15 0527 10/19/15 0446  NA 134* 136 134*  K 5.1 5.4* 5.0  CL 106 106 102  CO2 21* 21* 21*  GLUCOSE 96 101* 106*  BUN 63* 71* 84*  CREATININE 3.22* 3.52* 3.67*  CALCIUM 8.1* 8.2* 8.0*   LFT  Recent Labs  10/18/15 0527  PROT 5.0*  ALBUMIN 2.2*  AST 24  ALT 16  ALKPHOS 35*  BILITOT 0.5   PT/INR  Recent Labs  10/19/15 0446  LABPROT 18.0*  INR 1.48   Hepatitis Panel  Recent Labs  10/16/15 1950  HEPBSAG Negative   C-Diff No results for input(s): CDIFFTOX in the last 72 hours. Fecal Lactopherrin No results for input(s): FECLLACTOFRN in the last 72 hours.  Studies/Results: Nm Hepatobiliary Liver Func  10/17/2015  CLINICAL DATA:  25 year old female with history of right upper quadrant abdominal pain for 1 week. Nausea and vomiting. Pain worsens with fatty meals. EXAM: NUCLEAR MEDICINE HEPATOBILIARY IMAGING TECHNIQUE: Sequential images of the abdomen were obtained out to 60 minutes following intravenous administration of radiopharmaceutical. RADIOPHARMACEUTICALS:  5.2 mCi Tc-32m  Choletec IV COMPARISON:  No priors.  CT  the abdomen and pelvis 10/11/2015. FINDINGS: Prompt uptake and biliary excretion of activity by the liver is seen. Gallbladder activity is not visualized at any point during the examination, including after administration of 3 mg of IV morphine. Biliary activity passes into small bowel, consistent with patent common bile duct. IMPRESSION: 1. Nonvisualization of the gallbladder suggestive of cystic duct obstruction. This could indicate acute cholecystitis in the appropriate clinical setting (particularly in light of extensive biliary sludge noted in the gallbladder on prior ultrasound 10/15/2015); however, there multiple causes of false positivity on these examinations, including prolonged fasting. Clinical correlation is recommended. Electronically Signed   By: Trudie Reed M.D.   On: 10/17/2015 10:05    Medications:  Scheduled: . famotidine (PEPCID) IV  20 mg Intravenous Q24H  . [START ON 10/20/2015] heparin subcutaneous  5,000 Units Subcutaneous Q8H  . methylPREDNISolone (SOLU-MEDROL) injection  40 mg Intravenous Q8H  .  morphine injection  3 mg Intravenous Once  . ondansetron (ZOFRAN) IV  4 mg Intravenous Q8H   Continuous:   Assessment/Plan: 1) Ileus - improving. 2) AKI.   I am pleased to see that she was able to pass some stool.  Her stool was liquid, but she was provided with kayexalate yesterday.  Yesterday after my rounds the family reported that she started to pass flatus.  Today she is to undergo a renal biopsy.  I had a long discussion with the patient  and her family and they feel up to date with the current situation.  Plan: 1) Renal biopsy today. 2) Continue NPO status. 3) Continue with Solumedrol.  LOS: 7 days   Hiedi Touchton D 10/19/2015, 8:11 AM

## 2015-10-19 NOTE — Progress Notes (Addendum)
[facility]  PROGRESS NOTE  Caroline Elliott ZOX:[id number] DOB: 1990/04/13 DOA: 10/11/2015 PCP: Cain Saupe, MD    Brief HPI:  Pt. with PMH of GERD; admitted on 10/11/2015, with complaint of epigastric pain, was found to have enterocolitis as well as ascites. Patient underwent paracentesis and the findings were consistent with possible nephrotic syndrome, nephrology was consulted.NG tube was removed yesterday. Renal function continue to get worse.Renal biopsy planned for today.  Principal Problem:   Ascites Active Problems:   GERD (gastroesophageal reflux disease)   Abdominal pain, epigastric   Enterocolitis   Nausea and vomiting   Lymphadenopathy   Acute kidney injury (HCC)   Abdominal distension   Assessment/Plan: 1. Acute kidney injury/hypoalbuminemia/ascites- patient came with epigastric abdominal pain, CT of the abdomen was positive for ascites as well as small bowel and ascending colon thickening. Patient underwent paracentesis and fluid analysis was consistent with nephrotic syndrome. Nephrology consulted and deemed that it is not nephrotic syndrome. 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 Dose of Solumedrol changed to 40 mg IV q 8 hr. Plan for renal biopsy today.  2. ? Enterocolitis - CT scan abdomen was positive for small bowel wall thickening, gastroenterology was consulted, antibiotics were discontinued. Patient underwent enteroscopy and biopsy, patient found to have ileus so NG tube tube was inserted. NG tube now has been discontinued. Biopsy shows normal jejunal mucosa. 3. Hyperkalemia- potassium is 5.0 today.   DVT prophylaxis: Heparin Code Status: Full code Family Communication: Discussed with mother at bedside Disposition Plan: Pending improvement in symptoms   Consultants:  GI  Procedures:  Small bowel enteroscopy  Antibiotics:  None   Subjective: Patient seen and examined, apprehensive about renal  biopsy.  Objective: Filed Vitals:   10/19/15 0852 10/19/15 0915 10/19/15 0920 10/19/15 0925  BP: 113/86 121/86 116/78 115/80  Pulse: 97 93 103 94  Temp:      TempSrc:      Resp: 23 18 10 13   Height:      Weight:      SpO2: 98% 99% 92% 98%    Intake/Output Summary (Last 24 hours) at 10/19/15 1311 Last data filed at 10/19/15 0833  Gross per 24 hour  Intake     50 ml  Output      0 ml  Net     50 ml   Filed Weights   10/17/15 0621 10/18/15 0514 10/19/15 0457  Weight: 75.2 kg (165 lb 12.6 oz) 74.8 kg (164 lb 14.5 oz) 75.3 kg (166 lb 0.1 oz)    Examination:  General exam: Appears calm and comfortable  Respiratory system: Clear to auscultation. Respiratory effort normal. Cardiovascular system: S1 & S2 heard, RRR. No JVD, murmurs, rubs, gallops or clicks. No pedal edema. Gastrointestinal system: Abdomen is mildly distended , soft and nontender. No organomegaly or masses felt. Normal bowel sounds heard. Central nervous system: Alert and oriented. No focal neurological deficits. Extremities: Symmetric 5 x 5 power. Skin: No rashes, lesions or ulcers Psychiatry: Judgement and insight appear normal. Mood & affect appropriate.    Data Reviewed: I have personally reviewed following labs and imaging studies Basic Metabolic Panel:  Recent Labs Lab 10/13/15 0545  10/15/15 1822 10/16/15 0545 10/17/15 1200 10/18/15 0527 10/19/15 0446  NA 135  < > 133* 133* 134* 136 134*  K 3.8  < > 4.9 4.5 5.1 5.4* 5.0  CL 107  < > 105 105 106 106 102  CO2 24  < > 21* 21*  21* 21* 21*  GLUCOSE 90  < > 115* 115* 96 101* 106*  BUN 36*  < > 56* 56* 63* 71* 84*  CREATININE 1.55*  < > 2.86* 3.11* 3.22* 3.52* 3.67*  CALCIUM 7.9*  < > 7.8* 8.1* 8.1* 8.2* 8.0*  MG 1.9  --   --  2.3  --   --   --   < > = values in this interval not displayed. Liver Function Tests:  Recent Labs Lab 10/13/15 0545 10/15/15 0506 10/16/15 0545 10/17/15 1200 10/18/15 0527  AST 16 19 24 22 24   ALT 7* 10* 13* 13* 16   ALKPHOS 32* 28* 32* 34* 35*  BILITOT 0.5 0.5 0.5 0.8 0.5  PROT 4.2* 4.5* 5.5* 5.1* 5.0*  ALBUMIN 2.0* 2.2* 3.0* 2.5* 2.2*    Recent Labs Lab 10/15/15 0506  LIPASE 20   No results for input(s): AMMONIA in the last 168 hours. CBC:  Recent Labs Lab 10/13/15 0545 10/15/15 0506 10/15/15 1822 10/18/15 0527 10/19/15 0446  WBC 3.8* 2.6* 3.3* 4.7 5.9  NEUTROABS 2.9 1.6* 2.5  --   --   HGB 12.0 11.9* 14.5 11.4* 9.7*  HCT 36.2 34.8* 43.3 34.7* 28.5*  MCV 80.4 80.0 80.3 79.4 78.7  PLT 178 252 250 265 236   Cardiac Enzymes: No results for input(s): CKTOTAL, CKMB, CKMBINDEX, TROPONINI in the last 168 hours. BNP (last 3 results) No results for input(s): BNP in the last 8760 hours.  ProBNP (last 3 results) No results for input(s): PROBNP in the last 8760 hours.  CBG:  Recent Labs Lab 10/15/15 1608  GLUCAP 119*    Recent Results (from the past 240 hour(s))  Culture, body fluid-bottle     Status: None   Collection Time: 10/12/15 10:47 AM  Result Value Ref Range Status   Specimen Description FLUID PERITONEAL  Final   Special Requests NONE  Final   Culture NO GROWTH 5 DAYS  Final   Report Status 10/17/2015 FINAL  Final  Gram stain     Status: None   Collection Time: 10/12/15 10:47 AM  Result Value Ref Range Status   Specimen Description FLUID PERITONEAL  Final   Special Requests NONE  Final   Gram Stain   Final    RARE WBC PRESENT,BOTH PMN AND MONONUCLEAR NO ORGANISMS SEEN    Report Status 10/12/2015 FINAL  Final  Surgical pcr screen     Status: None   Collection Time: 10/16/15 11:26 AM  Result Value Ref Range Status   MRSA, PCR NEGATIVE NEGATIVE Final   Staphylococcus aureus NEGATIVE NEGATIVE Final    Comment:        The Xpert SA Assay (FDA approved for NASAL specimens in patients over 24 years of age), is one component of a comprehensive surveillance program.  Test performance has been validated by [facility] for patients greater than or equal to 1 year  old. It is not intended to diagnose infection nor to guide or monitor treatment.      Studies: [place] Biopsy  10/19/2015  INDICATION: 25 year old female with a history of medical renal disease. EXAM: ULTRASOUND BIOPSY CORE LIVER MEDICATIONS: None. ANESTHESIA/SEDATION: Moderate (conscious) sedation was employed during this procedure. A total of Versed 2.0 mg and Fentanyl 100 mcg was administered intravenously. Moderate Sedation Time: 15 minutes. The patient's level of consciousness and vital signs were monitored continuously by radiology nursing throughout the procedure under my direct supervision. FLUOROSCOPY TIME:  None COMPLICATIONS: None PROCEDURE: Informed written consent was obtained  from the patient after a thorough discussion of the procedural risks, benefits and alternatives. All questions were addressed. Maximal Sterile Barrier Technique was utilized including caps, mask, sterile gowns, sterile gloves, sterile drape, hand hygiene and skin antiseptic. A timeout was performed prior to the initiation of the procedure. Patient was positioned prone position on the gantry table. Images were stored sent to PACs. Once the patient is prepped and draped in the usual sterile fashion, the skin and subcutaneous tissues overlying the left kidney were generously infiltrated 1% lidocaine for local anesthesia. Using ultrasound guidance, a 15 gauge guide needle was advanced into the lower cortex of the left kidney. Once we confirmed location of the needle tip, 2 separate 16 gauge core biopsy were achieved. Two Gel-Foam pledgets were infused with a small amount of saline. The needle was removed. Final images were stored. The patient tolerated the procedure well and remained hemodynamically stable throughout. No complications were encountered and no significant blood loss encountered. IMPRESSION: Status post left medical renal biopsy, with tissue specimen sent to pathology for complete histopathologic analysis. Signed,  Yvone Neu. Loreta Ave, DO [facility] Electronically Signed   By: Gilmer Mor D.O.   On: 10/19/2015 11:49    Scheduled Meds: . famotidine (PEPCID) IV  20 mg Intravenous Q24H  . fentaNYL      . gelatin adsorbable      . [START ON 10/20/2015] heparin subcutaneous  5,000 Units Subcutaneous Q8H  . lidocaine      . methylPREDNISolone (SOLU-MEDROL) injection  40 mg Intravenous Q8H  . midazolam      .  morphine injection  3 mg Intravenous Once  . ondansetron (ZOFRAN) IV  4 mg Intravenous Q8H   Continuous Infusions:    Time spent: 25 min    [facility]  [facility] Pager [phone]. If 7PM-7AM, please contact night-coverage at [email or web address], Office  [phone]  password [email or web address] 10/19/2015, 1:11 PM  LOS: 7 days

## 2015-10-19 NOTE — Procedures (Signed)
Interventional Radiology Procedure Note  Procedure: US guided biopsy, 2 x 16G core.    Complications: None Recommendations:  - 3 hours bedrest - Ok to shower tomorrow - Do not submerge for 7 days - Routine line care   Signed,  Yvone NeuJaime S. Loreta AveWagner, DO

## 2015-10-19 NOTE — Progress Notes (Signed)
Patient ID: Caroline Elliott, female   DOB: 1990/12/17, 25 y.o.   MRN: [id number] S: s/p renal biopsy and doing well O:BP 115/80 mmHg  Pulse 94  Temp(Src) 98.6 F (37 C) (Oral)  Resp 13  Ht  (1.626 m)  Wt 75.3 kg (166 lb 0.1 oz)  BMI 28.48 kg/m2  SpO2 98%  LMP 09/30/2015  Intake/Output Summary (Last 24 hours) at 10/19/15 0957 Last data filed at 10/19/15 4098  Gross per 24 hour  Intake     50 ml  Output      0 ml  Net     50 ml   Intake/Output: I/O last 3 completed shifts: In: 460 [P.O.:360; IV Piggyback:100] Out: 600 [Urine:600]  Intake/Output this shift:    Weight change: 0.5 kg (1 lb 1.6 oz) Gen:WD WN AAF in NAD CVS:no rub Resp:cta JXB:JYNWGN Ext:no edema   Recent Labs Lab 10/13/15 0545 10/14/15 0440 10/15/15 0506 10/15/15 1822 10/16/15 0545 10/17/15 1200 10/18/15 0527 10/19/15 0446  NA 135 134* 134* 133* 133* 134* 136 134*  K 3.8 3.8 4.0 4.9 4.5 5.1 5.4* 5.0  CL 107 106 105 105 105 106 106 102  CO2 24 23 21* 21* 21* 21* 21* 21*  GLUCOSE 90 92 96 115* 115* 96 101* 106*  BUN 36* 43* 53* 56* 56* 63* 71* 84*  CREATININE 1.55* 2.20* 2.75* 2.86* 3.11* 3.22* 3.52* 3.67*  ALBUMIN 2.0*  --  2.2*  --  3.0* 2.5* 2.2*  --   CALCIUM 7.9* 7.6* 7.8* 7.8* 8.1* 8.1* 8.2* 8.0*  AST 16  --  19  --  --   ALT 7*  --  10*  --  13* 13* 16  --    Liver Function Tests:  Recent Labs Lab 10/16/15 0545 10/17/15 1200 10/18/15 0527  AST ALT 13* 13* 16  ALKPHOS 32* 34* 35*  BILITOT 0.5 0.8 0.5  PROT 5.5* 5.1* 5.0*  ALBUMIN 3.0* 2.5* 2.2*    Recent Labs Lab 10/15/15 0506  LIPASE 20   No results for input(s): AMMONIA in the last 168 hours. CBC:  Recent Labs Lab 10/13/15 0545 10/15/15 0506 10/15/15 1822 10/18/15 0527 10/19/15 0446  WBC 3.8* 2.6* 3.3* 4.7 5.9  NEUTROABS 2.9 1.6* 2.5  --   --   HGB 12.0 11.9* 14.5 11.4* 9.7*  HCT 36.2 34.8* 43.3 34.7* 28.5*  MCV 80.4 80.0 80.3 79.4 78.7  PLT 178 252 250 265 236   Cardiac Enzymes: No  results for input(s): CKTOTAL, CKMB, CKMBINDEX, TROPONINI in the last 168 hours. CBG:  Recent Labs Lab 10/15/15 1608  GLUCAP 119*    Iron Studies: No results for input(s): IRON, TIBC, TRANSFERRIN, FERRITIN in the last 72 hours. Studies/Results: No results found. . famotidine (PEPCID) IV  20 mg Intravenous Q24H  . fentaNYL      . gelatin adsorbable      . [START ON 10/20/2015] heparin subcutaneous  5,000 Units Subcutaneous Q8H  . lidocaine      . methylPREDNISolone (SOLU-MEDROL) injection  40 mg Intravenous Q8H  . midazolam      .  morphine injection  3 mg Intravenous Once  . ondansetron (ZOFRAN) IV  4 mg Intravenous Q8H    BMET    Component Value Date/Time   NA 134* 10/19/2015 0446   K 5.0 10/19/2015 0446   CL 102 10/19/2015 0446   CO2 21* 10/19/2015 0446   GLUCOSE 106* 10/19/2015 0446  BUN 84* 10/19/2015 0446   CREATININE 3.67* 10/19/2015 0446   CALCIUM 8.0* 10/19/2015 0446   GFRNONAA 16* 10/19/2015 0446   GFRAA 19* 10/19/2015 0446   CBC    Component Value Date/Time   WBC 5.9 10/19/2015 0446   RBC 3.62* 10/19/2015 0446   HGB 9.7* 10/19/2015 0446   HCT 28.5* 10/19/2015 0446   PLT 236 10/19/2015 0446   MCV 78.7 10/19/2015 0446   MCH 26.8 10/19/2015 0446   MCHC 34.0 10/19/2015 0446   RDW 14.2 10/19/2015 0446   LYMPHSABS 0.6* 10/15/2015 1822   MONOABS 0.2 10/15/2015 1822   EOSABS 0.0 10/15/2015 1822   BASOSABS 0.0 10/15/2015 1822     Assessment/Plan:  1. AKI- presumably related to contrast exposure as well as superimposed ATN due to paracentesis.  Scr rose from 0.94 on 10/09/15 and has continued to climb.  W/u revealed +ANA and pt with h/o lupus. 1. S/p renal biopsy 10/19/15 2. Ascites with low serum albumin 3. Bowel wall thickening 4. Protein malnutrition 5. SLE  Julien NordmannJoseph A Archimedes Harold

## 2015-10-20 DIAGNOSIS — R188 Other ascites: Secondary | ICD-10-CM

## 2015-10-20 DIAGNOSIS — K567 Ileus, unspecified: Secondary | ICD-10-CM

## 2015-10-20 DIAGNOSIS — R1110 Vomiting, unspecified: Secondary | ICD-10-CM

## 2015-10-20 DIAGNOSIS — N179 Acute kidney failure, unspecified: Secondary | ICD-10-CM | POA: Insufficient documentation

## 2015-10-20 LAB — CBC
HCT: 33.4 % — ABNORMAL LOW (ref 36.0–46.0)
HEMOGLOBIN: 11.2 g/dL — AB (ref 12.0–15.0)
MCH: 26.8 pg (ref 26.0–34.0)
MCHC: 33.5 g/dL (ref 30.0–36.0)
MCV: 79.9 fL (ref 78.0–100.0)
Platelets: 293 10*3/uL (ref 150–400)
RBC: 4.18 MIL/uL (ref 3.87–5.11)
RDW: 14.3 % (ref 11.5–15.5)
WBC: 4.3 10*3/uL (ref 4.0–10.5)

## 2015-10-20 LAB — BASIC METABOLIC PANEL
ANION GAP: 9 (ref 5–15)
BUN: 94 mg/dL — ABNORMAL HIGH (ref 6–20)
CALCIUM: 8.4 mg/dL — AB (ref 8.9–10.3)
CO2: 23 mmol/L (ref 22–32)
Chloride: 106 mmol/L (ref 101–111)
Creatinine, Ser: 2.99 mg/dL — ABNORMAL HIGH (ref 0.44–1.00)
GFR, EST AFRICAN AMERICAN: 24 mL/min — AB (ref >60)
GFR, EST NON AFRICAN AMERICAN: 21 mL/min — AB (ref >60)
Glucose, Bld: 100 mg/dL — ABNORMAL HIGH (ref 65–99)
Potassium: 5.4 mmol/L — ABNORMAL HIGH (ref 3.5–5.1)
SODIUM: 138 mmol/L (ref 135–145)

## 2015-10-20 LAB — ANTI-DNA ANTIBODY, DOUBLE-STRANDED: DS DNA AB: 271 [IU]/mL — AB (ref 0–9)

## 2015-10-20 MED ORDER — SODIUM POLYSTYRENE SULFONATE 15 GM/60ML PO SUSP
15.0000 g | Freq: Once | ORAL | Status: AC
  Administered 2015-10-20: 15 g via ORAL
  Filled 2015-10-20: qty 60

## 2015-10-20 MED ORDER — WHITE PETROLATUM GEL
Status: AC
  Administered 2015-10-20: 14:00:00
  Filled 2015-10-20: qty 1

## 2015-10-20 NOTE — Progress Notes (Signed)
Patient ID: Caroline Elliott, female   DOB: 1990-09-23, 25 y.o.   MRN: [id number] S:no new complaints.  Would like to eat O:BP 122/80 mmHg  Pulse 90  Temp(Src) 97.8 F (36.6 C) (Oral)  Resp 15  Ht 5\' 4"  (1.626 m)  Wt 95 kg (209 lb 7 oz)  BMI 35.93 kg/m2  SpO2 100%  LMP 09/30/2015  Intake/Output Summary (Last 24 hours) at 10/20/15 0916 Last data filed at 10/20/15 0600  Gross per 24 hour  Intake     50 ml  Output    400 ml  Net   -350 ml   Intake/Output: I/O last 3 completed shifts: In: 4350 [IV Piggyback:50] Out: 400 [Urine:400]  Intake/Output this shift:    Weight change: 19.7 kg (43 lb 6.9 oz) Gen:WD AAF in NAd CVS:no rub Resp:cta WUJ:WJXBJYAbd:benign Ext: tr edema   Recent Labs Lab 10/15/15 0506 10/15/15 1822 10/16/15 0545 10/17/15 1200 10/18/15 0527 10/19/15 0446 10/20/15 0352  NA 134* 133* 133* 134* 136 134* 138  K 4.0 4.9 4.5 5.1 5.4* 5.0 5.4*  CL 105 105 105 106 106 102 106  CO2 21* 21* 21* 21* 21* 21* 23  GLUCOSE 96 115* 115* 96 101* 106* 100*  BUN 53* 56* 56* 63* 71* 84* 94*  CREATININE 2.75* 2.86* 3.11* 3.22* 3.52* 3.67* 2.99*  ALBUMIN 2.2*  --  3.0* 2.5* 2.2*  --   --   CALCIUM 7.8* 7.8* 8.1* 8.1* 8.2* 8.0* 8.4*  AST 19  --  24 22 24   --   --   ALT 10*  --  13* 13* 16  --   --    Liver Function Tests:  Recent Labs Lab 10/16/15 0545 10/17/15 1200 10/18/15 0527  AST 24 22 24   ALT 13* 13* 16  ALKPHOS 32* 34* 35*  BILITOT 0.5 0.8 0.5  PROT 5.5* 5.1* 5.0*  ALBUMIN 3.0* 2.5* 2.2*    Recent Labs Lab 10/15/15 0506  LIPASE 20   No results for input(s): AMMONIA in the last 168 hours. CBC:  Recent Labs Lab 10/15/15 0506 10/15/15 1822 10/18/15 0527 10/19/15 0446 10/20/15 0352  WBC 2.6* 3.3* 4.7 5.9 4.3  NEUTROABS 1.6* 2.5  --   --   --   HGB 11.9* 14.5 11.4* 9.7* 11.2*  HCT 34.8* 43.3 34.7* 28.5* 33.4*  MCV 80.0 80.3 79.4 78.7 79.9  PLT 252 250 265 236 293   Cardiac Enzymes: No results for input(s): CKTOTAL, CKMB, CKMBINDEX, TROPONINI in the  last 168 hours. CBG:  Recent Labs Lab 10/15/15 1608  GLUCAP 119*    Iron Studies: No results for input(s): IRON, TIBC, TRANSFERRIN, FERRITIN in the last 72 hours. Studies/Results: [place] Biopsy  10/19/2015  INDICATION: 25 year old female with a history of medical renal disease. EXAM: ULTRASOUND BIOPSY CORE LIVER MEDICATIONS: None. ANESTHESIA/SEDATION: Moderate (conscious) sedation was employed during this procedure. A total of Versed 2.0 mg and Fentanyl 100 mcg was administered intravenously. Moderate Sedation Time: 15 minutes. The patient's level of consciousness and vital signs were monitored continuously by radiology nursing throughout the procedure under my direct supervision. FLUOROSCOPY TIME:  None COMPLICATIONS: None PROCEDURE: Informed written consent was obtained from the patient after a thorough discussion of the procedural risks, benefits and alternatives. All questions were addressed. Maximal Sterile Barrier Technique was utilized including caps, mask, sterile gowns, sterile gloves, sterile drape, hand hygiene and skin antiseptic. A timeout was performed prior to the initiation of the procedure. Patient was positioned prone position on the gantry table.  Images were stored sent to PACs. Once the patient is prepped and draped in the usual sterile fashion, the skin and subcutaneous tissues overlying the left kidney were generously infiltrated 1% lidocaine for local anesthesia. Using ultrasound guidance, a 15 gauge guide needle was advanced into the lower cortex of the left kidney. Once we confirmed location of the needle tip, 2 separate 16 gauge core biopsy were achieved. Two Gel-Foam pledgets were infused with a small amount of saline. The needle was removed. Final images were stored. The patient tolerated the procedure well and remained hemodynamically stable throughout. No complications were encountered and no significant blood loss encountered. IMPRESSION: Status post left medical renal biopsy,  with tissue specimen sent to pathology for complete histopathologic analysis. Signed, Yvone Neu. Loreta Ave, DO [facility] Electronically Signed   By: Gilmer Mor D.O.   On: 10/19/2015 11:49   . famotidine (PEPCID) IV  20 mg Intravenous Q24H  . heparin subcutaneous  5,000 Units Subcutaneous Q8H  . methylPREDNISolone (SOLU-MEDROL) injection  40 mg Intravenous Q8H  .  morphine injection  3 mg Intravenous Once  . ondansetron (ZOFRAN) IV  4 mg Intravenous Q8H    BMET    Component Value Date/Time   NA 138 10/20/2015 0352   K 5.4* 10/20/2015 0352   CL 106 10/20/2015 0352   CO2 23 10/20/2015 0352   GLUCOSE 100* 10/20/2015 0352   BUN 94* 10/20/2015 0352   CREATININE 2.99* 10/20/2015 0352   CALCIUM 8.4* 10/20/2015 0352   GFRNONAA 21* 10/20/2015 0352   GFRAA 24* 10/20/2015 0352   CBC    Component Value Date/Time   WBC 4.3 10/20/2015 0352   RBC 4.18 10/20/2015 0352   HGB 11.2* 10/20/2015 0352   HCT 33.4* 10/20/2015 0352   PLT 293 10/20/2015 0352   MCV 79.9 10/20/2015 0352   MCH 26.8 10/20/2015 0352   MCHC 33.5 10/20/2015 0352   RDW 14.3 10/20/2015 0352   LYMPHSABS 0.6* 10/15/2015 1822   MONOABS 0.2 10/15/2015 1822   EOSABS 0.0 10/15/2015 1822   BASOSABS 0.0 10/15/2015 1822     Assessment/Plan:  1. AKI- presumably related to contrast exposure as well as superimposed ATN due to paracentesis. Scr rose from 0.94 on 10/09/15 and has continued to climb. W/u revealed +ANA and pt with h/o lupus. 1. S/p renal biopsy 10/19/15 2. Scr continues to improve 2. Ascites with low serum albumin- unclear etiology.  W/u underway.  GI following 3. Hyperkalemia- due to AKI.  Renal diet and will give dose of kayexalate and recheck in am 4. Bowel wall thickening 5. Protein malnutrition 6. SLE 7. Ileus  Caroline Elliott

## 2015-10-20 NOTE — Progress Notes (Addendum)
[facility] Progress Note  (covering for Dr Nicholes MangoHung/Mann)  Chief Complaint: nausea and vomiting  Subjective History:  Nausea and vomiting have not recurred since NGT and enteroscopy. She had a loose BM yesterday and two more this AM.  Abd less distended and not painful. She is hungry Renal function is stable and she is making urine  ROS: Cardiovascular:  no chest pain Respiratory: no dyspnea  Objective:  Med list reviewed  Vital signs in last 24 hrs: Filed Vitals:   10/19/15 2150 10/20/15 0604  BP: 110/74 122/80  Pulse: 87 90  Temp: 97.8 F (36.6 C) 97.8 F (36.6 C)  Resp: 16 15    Physical Exam   HEENT: sclera anicteric, oral mucosa moist without lesions  Neck: supple, no thyromegaly, JVD or lymphadenopathy  Cardiac: RRR without murmurs, S1S2 heard, no peripheral edema  Pulm: clear to auscultation bilaterally, normal RR and effort noted  Abdomen: soft, no tenderness, with active bowel sounds. No guarding or palpable hepatosplenomegaly  Skin; warm and dry, no jaundice or rash  Recent Labs:   Recent Labs Lab 10/18/15 0527 10/19/15 0446 10/20/15 0352  WBC 4.7 5.9 4.3  HGB 11.4* 9.7* 11.2*  HCT 34.7* 28.5* 33.4*  PLT 265 236 293    Recent Labs Lab 10/18/15 0527  10/20/15 0352  NA 136  < > 138  K 5.4*  < > 5.4*  CL 106  < > 106  CO2 21*  < > 23  BUN 71*  < > 94*  ALBUMIN 2.2*  --   --   ALKPHOS 35*  --   --   ALT 16  --   --   AST 24  --   --   GLUCOSE 101*  < > 100*  < > = values in this interval not displayed.  Recent Labs Lab 10/19/15 0446  INR 1.48  creatinine 2.9 from 3.6  Peripheral eosinophil count zero  @ASSESSMENTPLANBEGIN @ Assessment:  Nausea and vomiting - resolved Ileus related to peritoneal inflammatory process, suspected serositis from SLE Ascites from SLE - on steroids .  Does not appear to be an eosinophilic process   Plan: Low residue diet for one week until we know that ileus has completely resolved. - ordered GI  will not plan to se this patient over the remainder of the weekend unless called.  Dr Elnoria HowardHung or Loreta AveMann to return Monday, but it appears that GI issue resolved and was related to rheumatologic process causing ascites.  25 minutes total time, over half spent in discussion with patient and her mother over the ileus and its management.  Charlie PitterHenry L Danis III Pager [phone] Mon-Fri 8a-5p [phone] after 5p, weekends, holidays

## 2015-10-20 NOTE — Progress Notes (Signed)
[facility]  PROGRESS NOTE  Charleston PootMichelle N Oliphant KVQ:[id number]:[id number] DOB: 14-Oct-1990 DOA: 10/11/2015 PCP: Cain SaupeFULP, CAMMIE, MD    Brief HPI:  Pt. with PMH of GERD; admitted on 10/11/2015, with complaint of epigastric pain, was found to have enterocolitis as well as ascites. Patient underwent paracentesis and the findings were consistent with possible nephrotic syndrome, nephrology was consulted.NG tube was removed yesterday. Renal function continued to get worse so renal biopsy was done yesterday.  Principal Problem:   Ascites Active Problems:   GERD (gastroesophageal reflux disease)   Abdominal pain, epigastric   Enterocolitis   Nausea and vomiting   Lymphadenopathy   Acute kidney injury (HCC)   Abdominal distension   Assessment/Plan: 1. Acute kidney injury/hypoalbuminemia/ascites- patient came with epigastric abdominal pain, CT of the abdomen was positive for ascites as well as small bowel and ascending colon thickening. Patient underwent paracentesis and fluid analysis was consistent with nephrotic syndrome. Nephrology consulted and deemed that it is not nephrotic syndrome. 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 Dose of Solumedrol changed to 40 mg IV q 8 hr. Patient underwent renal biopsy yesterday. Creatinine is 2.99 this morning, improved from yesterday 3.67 2. ? Enterocolitis - CT scan abdomen was positive for small bowel wall thickening, gastroenterology was consulted, antibiotics were discontinued. Patient underwent enteroscopy and biopsy, patient found to have ileus so NG tube tube was inserted. NG tube now has been discontinued. Biopsy shows normal jejunal mucosa. Patient started on soft diet. 3. Hyperkalemia- potassium is 5.4 today. Will give another dose of kayexalate.   DVT prophylaxis: Heparin Code Status: Full code Family Communication: Discussed with mother at bedside Disposition Plan: Pending improvement in  symptoms   Consultants:  GI  Procedures:  Small bowel enteroscopy  Antibiotics:  None   Subjective: Patient seen and examined, had 2 BMs this morning.  Objective: Filed Vitals:   10/19/15 0925 10/19/15 1418 10/19/15 2150 10/20/15 0604  BP: 115/80 122/83 110/74 122/80  Pulse: 94 94 87 90  Temp:  97.8 F (36.6 C) 97.8 F (36.6 C) 97.8 F (36.6 C)  TempSrc:  Oral Oral Oral  Resp: 13 15 16 15   Height:      Weight:    95 kg (209 lb 7 oz)  SpO2: 98% 100% 99% 100%    Intake/Output Summary (Last 24 hours) at 10/20/15 1303 Last data filed at 10/20/15 1001  Gross per 24 hour  Intake     50 ml  Output    900 ml  Net   -850 ml   Filed Weights   10/18/15 0514 10/19/15 0457 10/20/15 0604  Weight: 74.8 kg (164 lb 14.5 oz) 75.3 kg (166 lb 0.1 oz) 95 kg (209 lb 7 oz)    Examination:  General exam: Appears calm and comfortable  Respiratory system: Clear to auscultation. Respiratory effort normal. Cardiovascular system: S1 & S2 heard, RRR. No JVD, murmurs, rubs, gallops or clicks. No pedal edema. Gastrointestinal system: Abdomen is mildly distended , soft and nontender. No organomegaly or masses felt. Normal bowel sounds heard. Central nervous system: Alert and oriented. No focal neurological deficits. Extremities: Symmetric 5 x 5 power. Skin: No rashes, lesions or ulcers Psychiatry: Judgement and insight appear normal. Mood & affect appropriate.    Data Reviewed: I have personally reviewed following labs and imaging studies Basic Metabolic Panel:  Recent Labs Lab 10/16/15 0545 10/17/15 1200 10/18/15 0527 10/19/15 0446 10/20/15 0352  NA 133* 134* 136 134* 138  K 4.5 5.1 5.4*  5.0 5.4*  CL 105 106 106 102 106  CO2 21* 21* 21* 21* 23  GLUCOSE 115* 96 101* 106* 100*  BUN 56* 63* 71* 84* 94*  CREATININE 3.11* 3.22* 3.52* 3.67* 2.99*  CALCIUM 8.1* 8.1* 8.2* 8.0* 8.4*  MG 2.3  --   --   --   --    Liver Function Tests:  Recent Labs Lab 10/15/15 0506  10/16/15 0545 10/17/15 1200 10/18/15 0527  AST ALT 10* 13* 13* 16  ALKPHOS 28* 32* 34* 35*  BILITOT 0.5 0.5 0.8 0.5  PROT 4.5* 5.5* 5.1* 5.0*  ALBUMIN 2.2* 3.0* 2.5* 2.2*    Recent Labs Lab 10/15/15 0506  LIPASE 20   CBC:  Recent Labs Lab 10/15/15 0506 10/15/15 1822 10/18/15 0527 10/19/15 0446 10/20/15 0352  WBC 2.6* 3.3* 4.7 5.9 4.3  NEUTROABS 1.6* 2.5  --   --   --   HGB 11.9* 14.5 11.4* 9.7* 11.2*  HCT 34.8* 43.3 34.7* 28.5* 33.4*  MCV 80.0 80.3 79.4 78.7 79.9  PLT 252 250 265 236 293    CBG:  Recent Labs Lab 10/15/15 1608  GLUCAP 119*    Recent Results (from the past 240 hour(s))  Culture, body fluid-bottle     Status: None   Collection Time: 10/12/15 10:47 AM  Result Value Ref Range Status   Specimen Description FLUID PERITONEAL  Final   Special Requests NONE  Final   Culture NO GROWTH 5 DAYS  Final   Report Status 10/17/2015 FINAL  Final  Gram stain     Status: None   Collection Time: 10/12/15 10:47 AM  Result Value Ref Range Status   Specimen Description FLUID PERITONEAL  Final   Special Requests NONE  Final   Gram Stain   Final    RARE WBC PRESENT,BOTH PMN AND MONONUCLEAR NO ORGANISMS SEEN    Report Status 10/12/2015 FINAL  Final  Surgical pcr screen     Status: None   Collection Time: 10/16/15 11:26 AM  Result Value Ref Range Status   MRSA, PCR NEGATIVE NEGATIVE Final   Staphylococcus aureus NEGATIVE NEGATIVE Final    Comment:        The Xpert SA Assay (FDA approved for NASAL specimens in patients over 50 years of age), is one component of a comprehensive surveillance program.  Test performance has been validated by [facility] for patients greater than or equal to 27 year old. It is not intended to diagnose infection nor to guide or monitor treatment.      Studies: US Biopsy  10/19/2015  INDICATION: 25 year old female with a history of medical renal disease. EXAM: ULTRASOUND BIOPSY CORE LIVER MEDICATIONS: None.  ANESTHESIA/SEDATION: Moderate (conscious) sedation was employed during this procedure. A total of Versed 2.0 mg and Fentanyl 100 mcg was administered intravenously. Moderate Sedation Time: 15 minutes. The patient's level of consciousness and vital signs were monitored continuously by radiology nursing throughout the procedure under my direct supervision. FLUOROSCOPY TIME:  None COMPLICATIONS: None PROCEDURE: Informed written consent was obtained from the patient after a thorough discussion of the procedural risks, benefits and alternatives. All questions were addressed. Maximal Sterile Barrier Technique was utilized including caps, mask, sterile gowns, sterile gloves, sterile drape, hand hygiene and skin antiseptic. A timeout was performed prior to the initiation of the procedure. Patient was positioned prone position on the gantry table. Images were stored sent to PACs. Once the patient is prepped and draped in the usual sterile  fashion, the skin and subcutaneous tissues overlying the left kidney were generously infiltrated 1% lidocaine for local anesthesia. Using ultrasound guidance, a 15 gauge guide needle was advanced into the lower cortex of the left kidney. Once we confirmed location of the needle tip, 2 separate 16 gauge core biopsy were achieved. Two Gel-Foam pledgets were infused with a small amount of saline. The needle was removed. Final images were stored. The patient tolerated the procedure well and remained hemodynamically stable throughout. No complications were encountered and no significant blood loss encountered. IMPRESSION: Status post left medical renal biopsy, with tissue specimen sent to pathology for complete histopathologic analysis. Signed, Yvone Neu. Loreta Ave, DO [facility] Electronically Signed   By: Gilmer Mor D.O.   On: 10/19/2015 11:49    Scheduled Meds: . famotidine (PEPCID) IV  20 mg Intravenous Q24H  . heparin  subcutaneous  5,000 Units Subcutaneous Q8H  . methylPREDNISolone (SOLU-MEDROL) injection  40 mg Intravenous Q8H  .  morphine injection  3 mg Intravenous Once  . ondansetron (ZOFRAN) IV  4 mg Intravenous Q8H  . white petrolatum       Continuous Infusions:    Time spent: 25 min    [facility]  [facility] Pager [phone]. If 7PM-7AM, please contact night-coverage at [email or web address], Office  [phone]  password [email or web address] 10/20/2015, 1:03 PM  LOS: 8 days

## 2015-10-21 LAB — RENAL FUNCTION PANEL
ALBUMIN: 2.1 g/dL — AB (ref 3.5–5.0)
Anion gap: 5 (ref 5–15)
BUN: 88 mg/dL — ABNORMAL HIGH (ref 6–20)
CHLORIDE: 108 mmol/L (ref 101–111)
CO2: 23 mmol/L (ref 22–32)
CREATININE: 2.12 mg/dL — AB (ref 0.44–1.00)
Calcium: 8.2 mg/dL — ABNORMAL LOW (ref 8.9–10.3)
GFR, EST AFRICAN AMERICAN: 37 mL/min — AB (ref >60)
GFR, EST NON AFRICAN AMERICAN: 32 mL/min — AB (ref >60)
Glucose, Bld: 113 mg/dL — ABNORMAL HIGH (ref 65–99)
PHOSPHORUS: 4.3 mg/dL (ref 2.5–4.6)
POTASSIUM: 4.8 mmol/L (ref 3.5–5.1)
Sodium: 136 mmol/L (ref 135–145)

## 2015-10-21 MED ORDER — HYDROMORPHONE HCL 1 MG/ML IJ SOLN
0.5000 mg | Freq: Once | INTRAMUSCULAR | Status: AC
  Administered 2015-10-22: 0.5 mg via INTRAVENOUS
  Filled 2015-10-21: qty 1

## 2015-10-21 MED ORDER — PREDNISONE 20 MG PO TABS
20.0000 mg | ORAL_TABLET | Freq: Two times a day (BID) | ORAL | Status: DC
  Administered 2015-10-22 – 2015-10-23 (×4): 20 mg via ORAL
  Filled 2015-10-21 (×5): qty 1

## 2015-10-21 MED ORDER — FAMOTIDINE 20 MG PO TABS
20.0000 mg | ORAL_TABLET | Freq: Every day | ORAL | Status: DC
  Administered 2015-10-22 – 2015-10-24 (×2): 20 mg via ORAL
  Filled 2015-10-21 (×3): qty 1

## 2015-10-21 NOTE — Progress Notes (Signed)
[facility]  PROGRESS NOTE  Charleston PootMichelle N Lear VWU:[id number] DOB: 1990-08-04 DOA: 10/11/2015 PCP: Cain SaupeFULP, CAMMIE, MD    Brief HPI:  Pt. with PMH of GERD; admitted on 10/11/2015, with complaint of epigastric pain, was found to have enterocolitis as well as ascites. Patient underwent paracentesis and the findings were consistent with possible nephrotic syndrome, nephrology was consulted.NG tube was removed yesterday. Renal function continued to get worse so renal biopsy was done yesterday.  Principal Problem:   Ascites Active Problems:   GERD (gastroesophageal reflux disease)   Abdominal pain, epigastric   Enterocolitis   Nausea and vomiting   Lymphadenopathy   Acute kidney injury (HCC)   Abdominal distension   AKI (acute kidney injury) (HCC)   Assessment/Plan: 1. Acute kidney injury/hypoalbuminemia/ascites- patient came with epigastric abdominal pain, CT of the abdomen was positive for ascites as well as small bowel and ascending colon thickening. Patient underwent paracentesis and fluid analysis was consistent with nephrotic syndrome. Nephrology consulted and deemed that it is not nephrotic syndrome. 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 Dose of Solumedrol changed to 40 mg IV q 8 hr. Patient underwent renal biopsy yesterday. Creatinine is 2.99 this morning, improved from yesterday 3.67 2. SLE- ANA positive, Anti DNA antibdy ds is positive. Will need rheumatology evaluation as outpatient. 3. ? Enterocolitis - CT scan abdomen was positive for small bowel wall thickening, gastroenterology was consulted, antibiotics were discontinued. Patient underwent enteroscopy and biopsy, patient found to have ileus so NG tube tube was inserted. NG tube now has been discontinued. Biopsy shows normal jejunal mucosa. Patient started on soft diet. 4. Hyperkalemia- potassium is 5.4 today. Will give another dose of kayexalate.   DVT prophylaxis: Heparin Code  Status: Full code Family Communication: Discussed with mother at bedside Disposition Plan: Pending improvement in symptoms   Consultants:  GI  Procedures:  Small bowel enteroscopy  Antibiotics:  None   Subjective: Patient seen and examined, tolerating diet well.  Objective: Filed Vitals:   10/20/15 0604 10/20/15 1450 10/20/15 2135 10/21/15 0600  BP: 122/80 114/82 119/87 125/82  Pulse: 90 95 90 92  Temp: 97.8 F (36.6 C) 98.6 F (37 C) 98.1 F (36.7 C) 98.5 F (36.9 C)  TempSrc: Oral Oral Oral Oral  Resp: 15 16 16 18   Height:      Weight: 95 kg (209 lb 7 oz)   73.7 kg (162 lb 7.7 oz)  SpO2: 100% 100% 99% 98%    Intake/Output Summary (Last 24 hours) at 10/21/15 1239 Last data filed at 10/21/15 1135  Gross per 24 hour  Intake    700 ml  Output      0 ml  Net    700 ml   Filed Weights   10/19/15 0457 10/20/15 0604 10/21/15 0600  Weight: 75.3 kg (166 lb 0.1 oz) 95 kg (209 lb 7 oz) 73.7 kg (162 lb 7.7 oz)    Examination:  General exam: Appears calm and comfortable  Respiratory system: Clear to auscultation. Respiratory effort normal. Cardiovascular system: S1 & S2 heard, RRR. No JVD, murmurs, rubs, gallops or clicks. No pedal edema. Gastrointestinal system: Abdomen is mildly distended , soft and nontender. No organomegaly or masses felt. Normal bowel sounds heard. Central nervous system: Alert and oriented. No focal neurological deficits. Extremities: Symmetric 5 x 5 power. Skin: No rashes, lesions or ulcers Psychiatry: Judgement and insight appear normal. Mood & affect appropriate.    Data Reviewed: I have personally reviewed following labs and  imaging studies Basic Metabolic Panel:  Recent Labs Lab 10/16/15 0545 10/17/15 1200 10/18/15 0527 10/19/15 0446 10/20/15 0352 10/21/15 0413  NA 133* 134* 136 134* 138 136  K 4.5 5.1 5.4* 5.0 5.4* 4.8  CL 105 106 106 102 106 108  CO2 21* 21* 21* 21* 23 23  GLUCOSE 115* 96 101* 106* 100* 113*  BUN 56* 63*  71* 84* 94* 88*  CREATININE 3.11* 3.22* 3.52* 3.67* 2.99* 2.12*  CALCIUM 8.1* 8.1* 8.2* 8.0* 8.4* 8.2*  MG 2.3  --   --   --   --   --   PHOS  --   --   --   --   --  4.3   Liver Function Tests:  Recent Labs Lab 10/15/15 0506 10/16/15 0545 10/17/15 1200 10/18/15 0527 10/21/15 0413  AST --   ALT 10* 13* 13* 16  --   ALKPHOS 28* 32* 34* 35*  --   BILITOT 0.5 0.5 0.8 0.5  --   PROT 4.5* 5.5* 5.1* 5.0*  --   ALBUMIN 2.2* 3.0* 2.5* 2.2* 2.1*    Recent Labs Lab 10/15/15 0506  LIPASE 20   CBC:  Recent Labs Lab 10/15/15 0506 10/15/15 1822 10/18/15 0527 10/19/15 0446 10/20/15 0352  WBC 2.6* 3.3* 4.7 5.9 4.3  NEUTROABS 1.6* 2.5  --   --   --   HGB 11.9* 14.5 11.4* 9.7* 11.2*  HCT 34.8* 43.3 34.7* 28.5* 33.4*  MCV 80.0 80.3 79.4 78.7 79.9  PLT 252 250 265 236 293    CBG:  Recent Labs Lab 10/15/15 1608  GLUCAP 119*    Recent Results (from the past 240 hour(s))  Culture, body fluid-bottle     Status: None   Collection Time: 10/12/15 10:47 AM  Result Value Ref Range Status   Specimen Description FLUID PERITONEAL  Final   Special Requests NONE  Final   Culture NO GROWTH 5 DAYS  Final   Report Status 10/17/2015 FINAL  Final  Gram stain     Status: None   Collection Time: 10/12/15 10:47 AM  Result Value Ref Range Status   Specimen Description FLUID PERITONEAL  Final   Special Requests NONE  Final   Gram Stain   Final    RARE WBC PRESENT,BOTH PMN AND MONONUCLEAR NO ORGANISMS SEEN    Report Status 10/12/2015 FINAL  Final  Surgical pcr screen     Status: None   Collection Time: 10/16/15 11:26 AM  Result Value Ref Range Status   MRSA, PCR NEGATIVE NEGATIVE Final   Staphylococcus aureus NEGATIVE NEGATIVE Final    Comment:        The Xpert SA Assay (FDA approved for NASAL specimens in patients over 27 years of age), is one component of a comprehensive surveillance program.  Test performance has been validated by [facility] for patients  greater than or equal to 25 year old. It is not intended to diagnose infection nor to guide or monitor treatment.      Studies: No results found.  Scheduled Meds: . [START ON 10/22/2015] famotidine  20 mg Oral Daily  . heparin subcutaneous  5,000 Units Subcutaneous Q8H  . methylPREDNISolone (SOLU-MEDROL) injection  40 mg Intravenous Q8H  .  morphine injection  3 mg Intravenous Once  . ondansetron (ZOFRAN) IV  4 mg Intravenous Q8H   Continuous Infusions:    Time spent: 25 min    [facility]  [facility] Pager [phone]. If  7PM-7AM, please contact night-coverage at [email or web address], Office  [phone]  password [email or web address] 10/21/2015, 12:39 PM  LOS: 9 days

## 2015-10-21 NOTE — Progress Notes (Signed)
Patient ID: Caroline Elliott, female   DOB: 03/20/1991, 25 y.o.   MRN: [id number] S:feeling better O:BP 125/82 mmHg  Pulse 92  Temp(Src) 98.5 F (36.9 C) (Oral)  Resp 18  Ht 5\' 4"  (1.626 m)  Wt 73.7 kg (162 lb 7.7 oz)  BMI 27.88 kg/m2  SpO2 98%  LMP 09/30/2015  Intake/Output Summary (Last 24 hours) at 10/21/15 1011 Last data filed at 10/20/15 1830  Gross per 24 hour  Intake    480 ml  Output      0 ml  Net    480 ml   Intake/Output: I/O last 3 completed shifts: In: 480 [P.O.:480] Out: 900 [Urine:900]  Intake/Output this shift:    Weight change: -21.3 kg (-46 lb 15.3 oz) Gen:WD WN AAF in NAD CVS:no rub Resp:cta IRJ:[id number]:distended Ext:no edema   Recent Labs Lab 10/15/15 0506 10/15/15 1822 10/16/15 0545 10/17/15 1200 10/18/15 0527 10/19/15 0446 10/20/15 0352 10/21/15 0413  NA 134* 133* 133* 134* 136 134* 138 136  K 4.0 4.9 4.5 5.1 5.4* 5.0 5.4* 4.8  CL 105 105 105 106 106 102 106 108  CO2 21* 21* 21* 21* 21* 21* 23 23  GLUCOSE 96 115* 115* 96 101* 106* 100* 113*  BUN 53* 56* 56* 63* 71* 84* 94* 88*  CREATININE 2.75* 2.86* 3.11* 3.22* 3.52* 3.67* 2.99* 2.12*  ALBUMIN 2.2*  --  3.0* 2.5* 2.2*  --   --  2.1*  CALCIUM 7.8* 7.8* 8.1* 8.1* 8.2* 8.0* 8.4* 8.2*  PHOS  --   --   --   --   --   --   --  4.3  AST 19  --  24 22 24   --   --   --   ALT 10*  --  13* 13* 16  --   --   --    Liver Function Tests:  Recent Labs Lab 10/16/15 0545 10/17/15 1200 10/18/15 0527 10/21/15 0413  AST 24 22 24   --   ALT 13* 13* 16  --   ALKPHOS 32* 34* 35*  --   BILITOT 0.5 0.8 0.5  --   PROT 5.5* 5.1* 5.0*  --   ALBUMIN 3.0* 2.5* 2.2* 2.1*    Recent Labs Lab 10/15/15 0506  LIPASE 20   No results for input(s): AMMONIA in the last 168 hours. CBC:  Recent Labs Lab 10/15/15 0506 10/15/15 1822 10/18/15 0527 10/19/15 0446 10/20/15 0352  WBC 2.6* 3.3* 4.7 5.9 4.3  NEUTROABS 1.6* 2.5  --   --   --   HGB 11.9* 14.5 11.4* 9.7* 11.2*  HCT 34.8* 43.3 34.7* 28.5* 33.4*  MCV  80.0 80.3 79.4 78.7 79.9  PLT 252 250 265 236 293   Cardiac Enzymes: No results for input(s): CKTOTAL, CKMB, CKMBINDEX, TROPONINI in the last 168 hours. CBG:  Recent Labs Lab 10/15/15 1608  GLUCAP 119*    Iron Studies: No results for input(s): IRON, TIBC, TRANSFERRIN, FERRITIN in the last 72 hours. Studies/Results: No results found. . famotidine (PEPCID) IV  20 mg Intravenous Q24H  . heparin subcutaneous  5,000 Units Subcutaneous Q8H  . methylPREDNISolone (SOLU-MEDROL) injection  40 mg Intravenous Q8H  .  morphine injection  3 mg Intravenous Once  . ondansetron (ZOFRAN) IV  4 mg Intravenous Q8H    BMET    Component Value Date/Time   NA 136 10/21/2015 0413   K 4.8 10/21/2015 0413   CL 108 10/21/2015 0413   CO2 23 10/21/2015 0413  GLUCOSE 113* 10/21/2015 0413   BUN 88* 10/21/2015 0413   CREATININE 2.12* 10/21/2015 0413   CALCIUM 8.2* 10/21/2015 0413   GFRNONAA 32* 10/21/2015 0413   GFRAA 37* 10/21/2015 0413   CBC    Component Value Date/Time   WBC 4.3 10/20/2015 0352   RBC 4.18 10/20/2015 0352   HGB 11.2* 10/20/2015 0352   HCT 33.4* 10/20/2015 0352   PLT 293 10/20/2015 0352   MCV 79.9 10/20/2015 0352   MCH 26.8 10/20/2015 0352   MCHC 33.5 10/20/2015 0352   RDW 14.3 10/20/2015 0352   LYMPHSABS 0.6* 10/15/2015 1822   MONOABS 0.2 10/15/2015 1822   EOSABS 0.0 10/15/2015 1822   BASOSABS 0.0 10/15/2015 1822     Assessment/Plan:  1. AKI- presumably related to contrast exposure as well as superimposed ATN due to paracentesis. Scr rose from 0.94 on 10/09/15 and has continued to climb. W/u revealed +ANA and pt with h/o lupus. 1. S/p renal biopsy 10/19/15 2. Scr continues to improve 3. Stop solumedrol and start prednisone  daily 4. Will need to f/u with Dr. Marisue Humble at our office once discharged and f/u biopsy results 2. Ascites with low serum albumin- unclear etiology. W/u underway. GI following 3. Hyperkalemia- due to AKI. Renal diet and will give dose of  kayexalate and recheck in am 4. Bowel wall thickening 5. Protein malnutrition 6. SLE 7. Ileus  Julien Nordmann Jacey Pelc

## 2015-10-22 LAB — RENAL FUNCTION PANEL
ALBUMIN: 2.2 g/dL — AB (ref 3.5–5.0)
ANION GAP: 4 — AB (ref 5–15)
BUN: 79 mg/dL — ABNORMAL HIGH (ref 6–20)
CALCIUM: 7.7 mg/dL — AB (ref 8.9–10.3)
CO2: 23 mmol/L (ref 22–32)
Chloride: 110 mmol/L (ref 101–111)
Creatinine, Ser: 1.77 mg/dL — ABNORMAL HIGH (ref 0.44–1.00)
GFR calc Af Amer: 46 mL/min — ABNORMAL LOW (ref >60)
GFR calc non Af Amer: 39 mL/min — ABNORMAL LOW (ref >60)
GLUCOSE: 96 mg/dL (ref 65–99)
PHOSPHORUS: 3 mg/dL (ref 2.5–4.6)
POTASSIUM: 4.5 mmol/L (ref 3.5–5.1)
Sodium: 137 mmol/L (ref 135–145)

## 2015-10-22 MED ORDER — BOOST / RESOURCE BREEZE PO LIQD
1.0000 | Freq: Three times a day (TID) | ORAL | Status: DC
  Administered 2015-10-22 – 2015-10-23 (×2): 1 via ORAL

## 2015-10-22 NOTE — Progress Notes (Signed)
Nutrition Follow-up  DOCUMENTATION CODES:   Not applicable  INTERVENTION:  -Boost Breeze po TID, each supplement provides 250 kcal and 9 grams of protein -RD to continue to monitor  NUTRITION DIAGNOSIS:   Inadequate oral intake related to altered GI function as evidenced by NPO status. -resolved with diet advancement  GOAL:   Patient will meet greater than or equal to 90% of their needs -not meeting  MONITOR:   PO intake, Supplement acceptance, Diet advancement, Labs, Weight trends, Skin, I & O's  REASON FOR ASSESSMENT:   Malnutrition Screening Tool    ASSESSMENT:   Caroline Elliott is a 25 y.o. female with medical history significant of GERD and allergies; who presents with complaints of abdominal pain with nausea and vomiting. Symptoms have been intermittent and initially started in 04/2015. Abdominal   Spoke with Ms. Caroline Elliott's mother, pt was sleeping soundly, did not disturb. Per Mother, pt did not have any food for lunch or breakfast, drank apple juice with both meals. States pt felt dehydrated. She did have an episode of vomiting last night, but per mom states "I feel fine, ready to go home," today. Mom believes pt would benefit from [facility]. Will attempt. Per chart review, pt is displaying symptoms of Lupus, will be evaluated by rheumatology outpt.  Labs and medications reviewed: Prednisone  Diet Order:  DIET SOFT Room service appropriate?: Yes; Fluid consistency:: Thin  Skin:  Reviewed, no issues  Last BM:  10/15/15  Height:   Ht Readings from Last 1 Encounters:  10/11/15 5\' 4"  (1.626 m)    Weight:   Wt Readings from Last 1 Encounters:  10/22/15 158 lb 8.2 oz (71.9 kg)    Ideal Body Weight:  54.5 kg  BMI:  Body mass index is 27.19 kg/(m^2).  Estimated Nutritional Needs:   Kcal:  1500-1700  Protein:  75-90 grams  Fluid:  1.5-1.7 L  EDUCATION NEEDS:   No education needs identified at this time  Dionne AnoWilliam M. Arryana Tolleson, MS, RD LDN Inpatient  Clinical Dietitian Pager [phone]

## 2015-10-22 NOTE — Progress Notes (Signed)
Pt. hasn't been taking scheduled Zofran since ordered. She/ mother unaware med was ordered for prevention of nausea. Pt. started to vomit after lunch on yesterday for a total of 3 times. Has had Zofran 3 times and also c/o of increased abd.and back pain. Dilaudid 0.5mg . Given x 1 with relief.

## 2015-10-22 NOTE — Progress Notes (Signed)
[facility]  PROGRESS NOTE  Charleston PootMichelle N Sudol ZOX:[id number] DOB: 04/07/1991 DOA: 10/11/2015 PCP: Cain SaupeFULP, CAMMIE, MD    Brief HPI:  Pt. with PMH of GERD; admitted on 10/11/2015, with complaint of epigastric pain, was found to have enterocolitis as well as ascites. Patient underwent paracentesis and the findings were consistent with possible nephrotic syndrome, nephrology was consulted.NG tube was removed yesterday. Renal function continued to get worse so renal biopsy was done yesterday.  Principal Problem:   Ascites Active Problems:   GERD (gastroesophageal reflux disease)   Abdominal pain, epigastric   Enterocolitis   Nausea and vomiting   Lymphadenopathy   Acute kidney injury (HCC)   Abdominal distension   AKI (acute kidney injury) (HCC)   Assessment/Plan: 1. Acute kidney injury/hypoalbuminemia/ascites- patient came with epigastric abdominal pain, CT of the abdomen was positive for ascites as well as small bowel and ascending colon thickening. Patient underwent paracentesis and fluid analysis was consistent with nephrotic syndrome. Nephrology consulted and deemed that it is not nephrotic syndrome. 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 Dose of Solumedrol changed to 40 mg IV q 8 hr. Patient started on Prednisone 20 mg po bid.Patient underwent renal biopsy and the result is pending. Creatinine is 1.77 2. SLE- ANA positive, Anti DNA antibdy ds is positive. Will need rheumatology evaluation as outpatient. 3. ? Enterocolitis - CT scan abdomen was positive for small bowel wall thickening, gastroenterology was consulted, antibiotics were discontinued. Patient underwent enteroscopy and biopsy, patient found to have ileus so NG tube tube was inserted. NG tube now has been discontinued. Biopsy shows normal jejunal mucosa. Patient started on soft diet. 4. Hyperkalemia- resolved, potassium is 4.5 today.   DVT prophylaxis: Heparin Code Status: Full  code Family Communication: Discussed with mother at bedside Disposition Plan: Pending improvement in symptoms   Consultants:  GI  Procedures:  Small bowel enteroscopy  Antibiotics:  None   Subjective: Patient seen and examined, nausea and vomiting this morning.   Objective: Filed Vitals:   10/21/15 1445 10/21/15 2025 10/22/15 0442 10/22/15 1350  BP: 120/76 124/81 117/69 105/73  Pulse: 101 88 101 87  Temp: 97.9 F (36.6 C) 98.2 F (36.8 C) 98.4 F (36.9 C) 97.4 F (36.3 C)  TempSrc: Oral Oral Oral Oral  Resp: 18 16 18 18   Height:      Weight:   71.9 kg (158 lb 8.2 oz)   SpO2: 100% 97% 97% 99%    Intake/Output Summary (Last 24 hours) at 10/22/15 1536 Last data filed at 10/22/15 0920  Gross per 24 hour  Intake    462 ml  Output    850 ml  Net   -388 ml   Filed Weights   10/20/15 0604 10/21/15 0600 10/22/15 0442  Weight: 95 kg (209 lb 7 oz) 73.7 kg (162 lb 7.7 oz) 71.9 kg (158 lb 8.2 oz)    Examination:  General exam: Appears calm and comfortable  Respiratory system: Clear to auscultation. Respiratory effort normal. Cardiovascular system: S1 & S2 heard, RRR. No JVD, murmurs, rubs, gallops or clicks. No pedal edema. Gastrointestinal system: Abdomen is mildly distended , soft and nontender. No organomegaly or masses felt. Normal bowel sounds heard. Central nervous system: Alert and oriented. No focal neurological deficits. Extremities: Symmetric 5 x 5 power. Skin: No rashes, lesions or ulcers Psychiatry: Judgement and insight appear normal. Mood & affect appropriate.    Data Reviewed: I have personally reviewed following labs and imaging studies Basic Metabolic Panel:  Recent Labs Lab 10/16/15 0545  10/18/15 0527 10/19/15 0446 10/20/15 0352 10/21/15 0413 10/22/15 0421  NA 133*  < > 136 134* 138 136 137  K 4.5  < > 5.4* 5.0 5.4* 4.8 4.5  CL 105  < > 106 102 106 108 110  CO2 21*  < > 21* 21* GLUCOSE 115*  < > 101* 106* 100* 113* 96  BUN  56*  < > 71* 84* 94* 88* 79*  CREATININE 3.11*  < > 3.52* 3.67* 2.99* 2.12* 1.77*  CALCIUM 8.1*  < > 8.2* 8.0* 8.4* 8.2* 7.7*  MG 2.3  --   --   --   --   --   --   PHOS  --   --   --   --   --  4.3 3.0  < > = values in this interval not displayed. Liver Function Tests:  Recent Labs Lab 10/16/15 0545 10/17/15 1200 10/18/15 0527 10/21/15 0413 10/22/15 0421  AST --   --   ALT 13* 13* 16  --   --   ALKPHOS 32* 34* 35*  --   --   BILITOT 0.5 0.8 0.5  --   --   PROT 5.5* 5.1* 5.0*  --   --   ALBUMIN 3.0* 2.5* 2.2* 2.1* 2.2*   No results for input(s): LIPASE, AMYLASE in the last 168 hours. CBC:  Recent Labs Lab 10/15/15 1822 10/18/15 0527 10/19/15 0446 10/20/15 0352  WBC 3.3* 4.7 5.9 4.3  NEUTROABS 2.5  --   --   --   HGB 14.5 11.4* 9.7* 11.2*  HCT 43.3 34.7* 28.5* 33.4*  MCV 80.3 79.4 78.7 79.9  PLT 250 265 236 293    CBG:  Recent Labs Lab 10/15/15 1608  GLUCAP 119*    Recent Results (from the past 240 hour(s))  Surgical pcr screen     Status: None   Collection Time: 10/16/15 11:26 AM  Result Value Ref Range Status   MRSA, PCR NEGATIVE NEGATIVE Final   Staphylococcus aureus NEGATIVE NEGATIVE Final    Comment:        The Xpert SA Assay (FDA approved for NASAL specimens in patients over 75 years of age), is one component of a comprehensive surveillance program.  Test performance has been validated by [facility] for patients greater than or equal to 3 year old. It is not intended to diagnose infection nor to guide or monitor treatment.      Studies: No results found.  Scheduled Meds: . famotidine  20 mg Oral Daily  . heparin subcutaneous  5,000 Units Subcutaneous Q8H  .  morphine injection  3 mg Intravenous Once  . ondansetron (ZOFRAN) IV  4 mg Intravenous Q8H  . predniSONE  20 mg Oral BID WC   Continuous Infusions:    Time spent: 25 min    [facility]  [facility] Pager [phone]. If 7PM-7AM, please contact  night-coverage at [email or web address], Office  [phone]  password [email or web address] 10/22/2015, 3:36 PM  LOS: 10 days

## 2015-10-22 NOTE — Progress Notes (Signed)
Patient ID: Caroline Elliott, female   DOB: 11-Jul-1990, 25 y.o.   MRN: [id number] S: Feels better O:BP 117/69 mmHg  Pulse 101  Temp(Src) 98.4 F (36.9 C) (Oral)  Resp 18  Ht  (1.626 m)  Wt 71.9 kg (158 lb 8.2 oz)  BMI 27.19 kg/m2  SpO2 97%  LMP 09/30/2015  Intake/Output Summary (Last 24 hours) at 10/22/15 0909 Last data filed at 10/22/15 0245  Gross per 24 hour  Intake    682 ml  Output    450 ml  Net    232 ml   Intake/Output: I/O last 3 completed shifts: In: 682 [P.O.:582; IV Piggyback:100] Out: 450 [Urine:450]  Intake/Output this shift:    Weight change: -1.8 kg (-3 lb 15.5 oz) Gen:WD WN AAF in NAD CVS:no rub Resp:cta WUJ:[id number], nontender Ext:no edema   Recent Labs Lab 10/16/15 0545 10/17/15 1200 10/18/15 0527 10/19/15 0446 10/20/15 0352 10/21/15 0413 10/22/15 0421  NA 133* 134* 136 134* 138 136 137  K 4.5 5.1 5.4* 5.0 5.4* 4.8 4.5  CL 105 106 106 102 106 108 110  CO2 21* 21* 21* 21* GLUCOSE 115* 96 101* 106* 100* 113* 96  BUN 56* 63* 71* 84* 94* 88* 79*  CREATININE 3.11* 3.22* 3.52* 3.67* 2.99* 2.12* 1.77*  ALBUMIN 3.0* 2.5* 2.2*  --   --  2.1* 2.2*  CALCIUM 8.1* 8.1* 8.2* 8.0* 8.4* 8.2* 7.7*  PHOS  --   --   --   --   --  4.3 3.0  AST --   --   --   --   ALT 13* 13* 16  --   --   --   --    Liver Function Tests:  Recent Labs Lab 10/16/15 0545 10/17/15 1200 10/18/15 0527 10/21/15 0413 10/22/15 0421  AST --   --   ALT 13* 13* 16  --   --   ALKPHOS 32* 34* 35*  --   --   BILITOT 0.5 0.8 0.5  --   --   PROT 5.5* 5.1* 5.0*  --   --   ALBUMIN 3.0* 2.5* 2.2* 2.1* 2.2*   No results for input(s): LIPASE, AMYLASE in the last 168 hours. No results for input(s): AMMONIA in the last 168 hours. CBC:  Recent Labs Lab 10/15/15 1822 10/18/15 0527 10/19/15 0446 10/20/15 0352  WBC 3.3* 4.7 5.9 4.3  NEUTROABS 2.5  --   --   --   HGB 14.5 11.4* 9.7* 11.2*  HCT 43.3 34.7* 28.5* 33.4*  MCV 80.3 79.4 78.7 79.9   PLT 250 265 236 293   Cardiac Enzymes: No results for input(s): CKTOTAL, CKMB, CKMBINDEX, TROPONINI in the last 168 hours. CBG:  Recent Labs Lab 10/15/15 1608  GLUCAP 119*    Iron Studies: No results for input(s): IRON, TIBC, TRANSFERRIN, FERRITIN in the last 72 hours. Studies/Results: No results found. . famotidine  20 mg Oral Daily  . heparin subcutaneous  5,000 Units Subcutaneous Q8H  .  morphine injection  3 mg Intravenous Once  . ondansetron (ZOFRAN) IV  4 mg Intravenous Q8H  . predniSONE  20 mg Oral BID WC    BMET    Component Value Date/Time   NA 137 10/22/2015 0421   K 4.5 10/22/2015 0421   CL 110 10/22/2015 0421   CO2 23 10/22/2015 0421   GLUCOSE 96 10/22/2015 0421   BUN 79* 10/22/2015 0421  CREATININE 1.77* 10/22/2015 0421   CALCIUM 7.7* 10/22/2015 0421   GFRNONAA 39* 10/22/2015 0421   GFRAA 46* 10/22/2015 0421   CBC    Component Value Date/Time   WBC 4.3 10/20/2015 0352   RBC 4.18 10/20/2015 0352   HGB 11.2* 10/20/2015 0352   HCT 33.4* 10/20/2015 0352   PLT 293 10/20/2015 0352   MCV 79.9 10/20/2015 0352   MCH 26.8 10/20/2015 0352   MCHC 33.5 10/20/2015 0352   RDW 14.3 10/20/2015 0352   LYMPHSABS 0.6* 10/15/2015 1822   MONOABS 0.2 10/15/2015 1822   EOSABS 0.0 10/15/2015 1822   BASOSABS 0.0 10/15/2015 1822     Assessment/Plan:  1. AKI- presumably related to contrast exposure as well as superimposed ATN due to paracentesis. Scr rose from 0.94 on 10/09/15 and has continued to climb. W/u revealed +ANA and pt with h/o lupus. 1. S/p renal biopsy 10/19/15 2. Scr continues to improve 3. Stopped solumedrol 10/21/15 and started prednisone 40mg  daily  4. Will need to f/u with Dr. Marisue HumbleSanford at our office on 11/02/15 at 10:30 am at [address] to f/u biopsy results 2. Ascites with low serum albumin- unclear etiology. W/u underway. GI following 3. Hyperkalemia- due to AKI. Renal diet and will give dose of kayexalate and recheck in am 4. Bowel wall  thickening 5. Protein malnutrition 6. SLE 7. Ileus 8. Disposition- per primary svc.  Awaiting GI input. Julien NordmannJoseph A Rohit Deloria

## 2015-10-23 ENCOUNTER — Inpatient Hospital Stay (HOSPITAL_COMMUNITY): Payer: Commercial Managed Care - HMO

## 2015-10-23 LAB — RENAL FUNCTION PANEL
ANION GAP: 6 (ref 5–15)
Albumin: 2.1 g/dL — ABNORMAL LOW (ref 3.5–5.0)
BUN: 64 mg/dL — ABNORMAL HIGH (ref 6–20)
CHLORIDE: 108 mmol/L (ref 101–111)
CO2: 24 mmol/L (ref 22–32)
CREATININE: 1.55 mg/dL — AB (ref 0.44–1.00)
Calcium: 8.1 mg/dL — ABNORMAL LOW (ref 8.9–10.3)
GFR, EST AFRICAN AMERICAN: 54 mL/min — AB (ref >60)
GFR, EST NON AFRICAN AMERICAN: 46 mL/min — AB (ref >60)
Glucose, Bld: 124 mg/dL — ABNORMAL HIGH (ref 65–99)
POTASSIUM: 4.8 mmol/L (ref 3.5–5.1)
Phosphorus: 3.3 mg/dL (ref 2.5–4.6)
Sodium: 138 mmol/L (ref 135–145)

## 2015-10-23 LAB — PROTEIN, URINE, 24 HOUR
Collection Interval-UPROT: 24 hours
PROTEIN, 24H URINE: 2800 mg/d — AB (ref 50–100)
Protein, Urine: 560 mg/dL
URINE TOTAL VOLUME-UPROT: 500 mL

## 2015-10-23 MED ORDER — ALPRAZOLAM 0.25 MG PO TABS
0.2500 mg | ORAL_TABLET | Freq: Once | ORAL | Status: AC
Start: 2015-10-23 — End: 2015-10-23
  Administered 2015-10-23: 0.25 mg via ORAL
  Filled 2015-10-23: qty 1

## 2015-10-23 MED ORDER — ONDANSETRON HCL 4 MG/2ML IJ SOLN
4.0000 mg | INTRAMUSCULAR | Status: DC | PRN
Start: 2015-10-23 — End: 2015-10-23
  Administered 2015-10-23: 4 mg via INTRAVENOUS
  Filled 2015-10-23: qty 2

## 2015-10-23 MED ORDER — ONDANSETRON HCL 4 MG/2ML IJ SOLN
4.0000 mg | Freq: Four times a day (QID) | INTRAMUSCULAR | Status: DC | PRN
  Filled 2015-10-23: qty 2

## 2015-10-23 MED ORDER — HYDROMORPHONE HCL 1 MG/ML IJ SOLN
2.0000 mg | Freq: Once | INTRAMUSCULAR | Status: AC
Start: 2015-10-23 — End: 2015-10-23
  Administered 2015-10-23: 2 mg via INTRAVENOUS
  Filled 2015-10-23: qty 2

## 2015-10-23 MED ORDER — MORPHINE SULFATE (PF) 2 MG/ML IV SOLN
1.0000 mg | INTRAVENOUS | Status: DC | PRN
  Administered 2015-10-23 – 2015-10-24 (×5): 1 mg via INTRAVENOUS
  Filled 2015-10-23 (×5): qty 1

## 2015-10-23 MED ORDER — SODIUM CHLORIDE 0.9 % IV SOLN
INTRAVENOUS | Status: DC
  Administered 2015-10-23: 15:00:00 via INTRAVENOUS

## 2015-10-23 MED ORDER — MYCOPHENOLATE SODIUM 180 MG PO TBEC
360.0000 mg | DELAYED_RELEASE_TABLET | Freq: Two times a day (BID) | ORAL | Status: DC
  Administered 2015-10-23 – 2015-10-24 (×3): 360 mg via ORAL
  Filled 2015-10-23 (×4): qty 2

## 2015-10-23 MED ORDER — METHYLPREDNISOLONE SODIUM SUCC 40 MG IJ SOLR
40.0000 mg | Freq: Two times a day (BID) | INTRAMUSCULAR | Status: DC
  Administered 2015-10-24 (×2): 40 mg via INTRAVENOUS
  Filled 2015-10-23 (×3): qty 1

## 2015-10-23 NOTE — Progress Notes (Signed)
Subjective: Vomiting starting this AM and loose/watery bowel movements.  Objective: Vital signs in last 24 hours: Temp:  [98.1 F (36.7 C)-98.4 F (36.9 C)] 98.4 F (36.9 C) (07/18 0437) Pulse Rate:  [80-88] 88 (07/18 0809) Resp:  [17-18] 18 (07/18 0809) BP: (119-127)/(82-97) 127/97 mmHg (07/18 0809) SpO2:  [98 %-99 %] 99 % (07/18 0809) Weight:  [70.9 kg (156 lb 4.9 oz)] 70.9 kg (156 lb 4.9 oz) (07/18 0437) Last BM Date: 10/21/15  Intake/Output from previous day: 07/17 0701 - 07/18 0700 In: 450 [P.O.:450] Out: 1150 [Urine:1150] Intake/Output this shift: Total I/O In: 360 [P.O.:360] Out: -   General appearance: weak appearing GI: less distended, soft  Lab Results: No results for input(s): WBC, HGB, HCT, PLT in the last 72 hours. BMET  Recent Labs  10/21/15 0413 10/22/15 0421 10/23/15 0401  NA 136 137 138  K 4.8 4.5 4.8  CL 108 110 108  CO2 23 23 24   GLUCOSE 113* 96 124*  BUN 88* 79* 64*  CREATININE 2.12* 1.77* 1.55*  CALCIUM 8.2* 7.7* 8.1*   LFT  Recent Labs  10/23/15 0401  ALBUMIN 2.1*   PT/INR No results for input(s): LABPROT, INR in the last 72 hours. Hepatitis Panel No results for input(s): HEPBSAG, HCVAB, HEPAIGM, HEPBIGM in the last 72 hours. C-Diff No results for input(s): CDIFFTOX in the last 72 hours. Fecal Lactopherrin No results for input(s): FECLLACTOFRN in the last 72 hours.  Studies/Results: Dg Abd 2 Views  10/23/2015  CLINICAL DATA:  Vomiting and lower abdominal pain for 3 days. EXAM: ABDOMEN - 2 VIEW COMPARISON:  Renal ultrasound 10/19/2015; abdominal radiograph 10/16/2015. FINDINGS: Mildly gaseous distended loops of small bowel are demonstrated. Gas is demonstrated within the colon. Decubitus images demonstrate no free intraperitoneal air. Regional skeleton is unremarkable. IMPRESSION: Findings most compatible with ileus. Electronically Signed   By: Annia Beltrew  Davis M.D.   On: 10/23/2015 10:47    Medications:  Scheduled: . famotidine  20  mg Oral Daily  . feeding supplement  1 Container Oral TID BM  . heparin subcutaneous  5,000 Units Subcutaneous Q8H  . mycophenolate  360 mg Oral BID  . predniSONE  20 mg Oral BID WC   Continuous: . sodium chloride 100 mL/hr at 10/23/15 1509    Assessment/Plan: 1) Ileus. 2) SLE 3) Vomiting. 4) Diarrhea. 5) AKI   Her renal function is returning to normal, but clinically she has declined with the vomiting.  She also started to have more loose stools.  Currently she is taking mycophenolate, which can cause diarrhea and vomiting.  However, I want to make sure that she does not have C. Diff.  Her KUB reveals a persistent ileus, but she is passing flatus.  Also the mycophenolate was only started late this morning, making it less likely that the medication is the culprit for her symptoms.  Plan: 1) Check C. Diff. 2) Continue with prednisone and mycophenolate.  LOS: 11 days   Sabien Umland D 10/23/2015, 4:08 PM

## 2015-10-23 NOTE — Progress Notes (Signed)
[facility]  PROGRESS NOTE  Caroline Elliott OZH:[id number] DOB: 1990-09-30 DOA: 10/11/2015 PCP: Cain Saupe, MD    Brief HPI:  Pt. with PMH of GERD; admitted on 10/11/2015, with complaint of epigastric pain, was found to have enterocolitis as well as ascites. Patient underwent paracentesis and the findings were consistent with possible nephrotic syndrome, nephrology was consulted.NG tube was removed yesterday. Renal function continued to get worse so renal biopsy was done and results are pending at this time  Principal Problem:   Ascites Active Problems:   GERD (gastroesophageal reflux disease)   Abdominal pain, epigastric   Enterocolitis   Nausea and vomiting   Lymphadenopathy   Acute kidney injury (HCC)   Abdominal distension   AKI (acute kidney injury) (HCC)   Assessment/Plan:  1. Acute kidney injury/hypoalbuminemia/ascites- patient came with epigastric abdominal pain, CT of the abdomen was positive for ascites as well as small bowel and ascending colon thickening. Patient underwent paracentesis and fluid analysis was consistent with nephrotic syndrome. Nephrology consulted and deemed that it is not nephrotic syndrome. 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 Dose of Solumedrol changed to 40 mg IV q 8 hr. Patient started on Prednisone 20 mg po bid.Patient underwent renal biopsy and the result is pending. Creatinine is 1.55 2. Ileus- patient continues to have intractable nausea and vomiting with abdominal distention. X-ray abdomen confirms findings of ileus. Continue nothing by mouth, Zofran when necessary for nausea and vomiting. Called and discussed with Dr. Loreta Ave, she says Dr. Elnoria Howard will follow the patient. 3. SLE- ANA positive, Anti DNA antibdy ds is positive. Will need rheumatology evaluation as outpatient. Will need to call Dr Virgilio Frees in [facility] for outpatient appointment on discharge.Phone [phone] 4. ?  Enterocolitis - CT scan abdomen was positive for small bowel wall thickening, gastroenterology was consulted, antibiotics were discontinued. Patient underwent enteroscopy and biopsy, patient found to have ileus so NG tube tube was inserted. NG tube now has been discontinued. Biopsy shows normal jejunal mucosa.  5. Hyperkalemia- resolved.   DVT prophylaxis: Heparin Code Status: Full code Family Communication: Discussed with mother at bedside Disposition Plan: Pending improvement in symptoms   Consultants:  GI  Procedures:  Small bowel enteroscopy  Antibiotics:  None   Subjective: Patient seen and examined, continues to have nausea and vomiting.  Objective: Filed Vitals:   10/22/15 1350 10/22/15 2138 10/23/15 0437 10/23/15 0809  BP: 105/73 124/82 119/86 127/97  Pulse: 87 87 80 88  Temp: 97.4 F (36.3 C) 98.1 F (36.7 C) 98.4 F (36.9 C)   TempSrc: Oral Oral Oral   Resp: Height:      Weight:   70.9 kg (156 lb 4.9 oz)   SpO2: 99% 98% 99% 99%    Intake/Output Summary (Last 24 hours) at 10/23/15 1443 Last data filed at 10/23/15 0439  Gross per 24 hour  Intake    450 ml  Output    750 ml  Net   -300 ml   Filed Weights   10/21/15 0600 10/22/15 0442 10/23/15 0437  Weight: 73.7 kg (162 lb 7.7 oz) 71.9 kg (158 lb 8.2 oz) 70.9 kg (156 lb 4.9 oz)    Examination:  General exam: Appears calm and comfortable  Respiratory system: Clear to auscultation. Respiratory effort normal. Cardiovascular system: S1 & S2 heard, RRR. No JVD, murmurs, rubs, gallops or clicks. No pedal edema. Gastrointestinal system: Abdomen is mildly distended , soft and nontender.  No organomegaly or masses felt. Normal bowel sounds heard. Central nervous system: Alert and oriented. No focal neurological deficits. Extremities: Symmetric 5 x 5 power. Skin: No rashes, lesions or ulcers Psychiatry: Judgement and insight appear normal. Mood & affect appropriate.    Data Reviewed: I have  personally reviewed following labs and imaging studies Basic Metabolic Panel:  Recent Labs Lab 10/19/15 0446 10/20/15 0352 10/21/15 0413 10/22/15 0421 10/23/15 0401  NA 134* 138 136 137 138  K 5.0 5.4* 4.8 4.5 4.8  CL 102 106 108 110 108  CO2 21* 23 23 23 24   GLUCOSE 106* 100* 113* 96 124*  BUN 84* 94* 88* 79* 64*  CREATININE 3.67* 2.99* 2.12* 1.77* 1.55*  CALCIUM 8.0* 8.4* 8.2* 7.7* 8.1*  PHOS  --   --  4.3 3.0 3.3   Liver Function Tests:  Recent Labs Lab 10/17/15 1200 10/18/15 0527 10/21/15 0413 10/22/15 0421 10/23/15 0401  AST 22 24  --   --   --   ALT 13* 16  --   --   --   ALKPHOS 34* 35*  --   --   --   BILITOT 0.8 0.5  --   --   --   PROT 5.1* 5.0*  --   --   --   ALBUMIN 2.5* 2.2* 2.1* 2.2* 2.1*   No results for input(s): LIPASE, AMYLASE in the last 168 hours. CBC:  Recent Labs Lab 10/18/15 0527 10/19/15 0446 10/20/15 0352  WBC 4.7 5.9 4.3  HGB 11.4* 9.7* 11.2*  HCT 34.7* 28.5* 33.4*  MCV 79.4 78.7 79.9  PLT 265 236 293    CBG: No results for input(s): GLUCAP in the last 168 hours.  Recent Results (from the past 240 hour(s))  Surgical pcr screen     Status: None   Collection Time: 10/16/15 11:26 AM  Result Value Ref Range Status   MRSA, PCR NEGATIVE NEGATIVE Final   Staphylococcus aureus NEGATIVE NEGATIVE Final    Comment:        The Xpert SA Assay (FDA approved for NASAL specimens in patients over 24 years of age), is one component of a comprehensive surveillance program.  Test performance has been validated by [facility] for patients greater than or equal to 24 year old. It is not intended to diagnose infection nor to guide or monitor treatment.      Studies: Dg Abd 2 Views  10/23/2015  CLINICAL DATA:  Vomiting and lower abdominal pain for 3 days. EXAM: ABDOMEN - 2 VIEW COMPARISON:  Renal ultrasound 10/19/2015; abdominal radiograph 10/16/2015. FINDINGS: Mildly gaseous distended loops of small bowel are demonstrated. Gas is  demonstrated within the colon. Decubitus images demonstrate no free intraperitoneal air. Regional skeleton is unremarkable. IMPRESSION: Findings most compatible with ileus. Electronically Signed   By: Annia Beltrew  Davis M.D.   On: 10/23/2015 10:47    Scheduled Meds: . famotidine  20 mg Oral Daily  . feeding supplement  1 Container Oral TID BM  . heparin subcutaneous  5,000 Units Subcutaneous Q8H  . mycophenolate  360 mg Oral BID  . predniSONE  20 mg Oral BID WC   Continuous Infusions:    Time spent: 25 min    [facility],Akira Perusse S  [facility] Pager [phone]. If 7PM-7AM, please contact night-coverage at [email or web address], Office  [phone]  password [email or web address] 10/23/2015, 2:43 PM  LOS: 11 days

## 2015-10-23 NOTE — Progress Notes (Signed)
Patient ID: Caroline Elliott, female   DOB: [birth year], 25 y.o.   MRN: [id number] S:Still with N/V/anorexia O:BP 127/97 mmHg  Pulse 88  Temp(Src) 98.4 F (36.9 C) (Oral)  Resp 18  Ht 5\' 4"  (1.626 m)  Wt 70.9 kg (156 lb 4.9 oz)  BMI 26.82 kg/m2  SpO2 99%  LMP 09/30/2015  Intake/Output Summary (Last 24 hours) at 10/23/15 0842 Last data filed at 10/23/15 0439  Gross per 24 hour  Intake    450 ml  Output   1150 ml  Net   -700 ml   Intake/Output: I/O last 3 completed shifts: In: 690 [P.O.:690] Out: 1600 [Urine:1600]  Intake/Output this shift:    Weight change: -1 kg (-2 lb 3.3 oz) Gen:WD WN AAF  CVS:no rub Resp:cta NWG:NFAOZHYQMAbd:distended Ext: minimal edema   Recent Labs Lab 10/17/15 1200 10/18/15 0527 10/19/15 0446 10/20/15 0352 10/21/15 0413 10/22/15 0421 10/23/15 0401  NA 134* 136 134* 138 136 137 138  K 5.1 5.4* 5.0 5.4* 4.8 4.5 4.8  CL 106 106 102 106 108 110 108  CO2 21* 21* 21* 23 23 23 24   GLUCOSE 96 101* 106* 100* 113* 96 124*  BUN 63* 71* 84* 94* 88* 79* 64*  CREATININE 3.22* 3.52* 3.67* 2.99* 2.12* 1.77* 1.55*  ALBUMIN 2.5* 2.2*  --   --  2.1* 2.2* 2.1*  CALCIUM 8.1* 8.2* 8.0* 8.4* 8.2* 7.7* 8.1*  PHOS  --   --   --   --  4.3 3.0 3.3  AST 22 24  --   --   --   --   --   ALT 13* 16  --   --   --   --   --    Liver Function Tests:  Recent Labs Lab 10/17/15 1200 10/18/15 0527 10/21/15 0413 10/22/15 0421 10/23/15 0401  AST 22 24  --   --   --   ALT 13* 16  --   --   --   ALKPHOS 34* 35*  --   --   --   BILITOT 0.8 0.5  --   --   --   PROT 5.1* 5.0*  --   --   --   ALBUMIN 2.5* 2.2* 2.1* 2.2* 2.1*   No results for input(s): LIPASE, AMYLASE in the last 168 hours. No results for input(s): AMMONIA in the last 168 hours. CBC:  Recent Labs Lab 10/18/15 0527 10/19/15 0446 10/20/15 0352  WBC 4.7 5.9 4.3  HGB 11.4* 9.7* 11.2*  HCT 34.7* 28.5* 33.4*  MCV 79.4 78.7 79.9  PLT 265 236 293   Cardiac Enzymes: No results for input(s): CKTOTAL, CKMB,  CKMBINDEX, TROPONINI in the last 168 hours. CBG: No results for input(s): GLUCAP in the last 168 hours.  Iron Studies: No results for input(s): IRON, TIBC, TRANSFERRIN, FERRITIN in the last 72 hours. Studies/Results: No results found. . famotidine  20 mg Oral Daily  . feeding supplement  1 Container Oral TID BM  . heparin subcutaneous  5,000 Units Subcutaneous Q8H  .  morphine injection  3 mg Intravenous Once  . ondansetron (ZOFRAN) IV  4 mg Intravenous Q8H  . predniSONE  20 mg Oral BID WC    BMET    Component Value Date/Time   NA 138 10/23/2015 0401   K 4.8 10/23/2015 0401   CL 108 10/23/2015 0401   CO2 24 10/23/2015 0401   GLUCOSE 124* 10/23/2015 0401   BUN 64* 10/23/2015 0401   CREATININE  1.55* 10/23/2015 0401   CALCIUM 8.1* 10/23/2015 0401   GFRNONAA 46* 10/23/2015 0401   GFRAA 54* 10/23/2015 0401   CBC    Component Value Date/Time   WBC 4.3 10/20/2015 0352   RBC 4.18 10/20/2015 0352   HGB 11.2* 10/20/2015 0352   HCT 33.4* 10/20/2015 0352   PLT 293 10/20/2015 0352   MCV 79.9 10/20/2015 0352   MCH 26.8 10/20/2015 0352   MCHC 33.5 10/20/2015 0352   RDW 14.3 10/20/2015 0352   LYMPHSABS 0.6* 10/15/2015 1822   MONOABS 0.2 10/15/2015 1822   EOSABS 0.0 10/15/2015 1822   BASOSABS 0.0 10/15/2015 1822     Assessment/Plan:  1. AKI- presumably related to contrast exposure as well as superimposed ATN due to paracentesis. Scr rose from 0.94 on 10/09/15 and has continued to climb. W/u revealed +ANA and pt with h/o lupus. 1. S/p renal biopsy 10/19/15 which revealed some ATN as well as focal proliferative lupus nephritis 2. Scr continues to improve with only prednisone 3. Stopped solumedrol 10/21/15 and started prednisone  daily  4. Discussed case with Dr. Marisue Humble and best option is to try myfortic  bid (cellcept likely to cause worsening GI symptoms) over cytoxan given her age and concern over fertility. 5. Will need to f/u with Dr. Marisue Humble at our office on  11/02/15 at 10:30 am at [address] to f/u biopsy results 2. Ascites with low serum albumin- unclear etiology. W/u underway. GI following 3. Hyperkalemia- due to AKI. Renal diet and will give dose of kayexalate and recheck in am 4. Bowel wall thickening 5. Protein malnutrition 6. SLE 7. Ileus 8. Disposition- per primary svc. Awaiting GI input.   f/u with Dr Marisue Humble on 11/02/15 as above.  Caroline Elliott

## 2015-10-24 DIAGNOSIS — M3214 Glomerular disease in systemic lupus erythematosus: Secondary | ICD-10-CM

## 2015-10-24 LAB — CBC
HCT: 33.8 % — ABNORMAL LOW (ref 36.0–46.0)
HEMOGLOBIN: 11.1 g/dL — AB (ref 12.0–15.0)
MCH: 26.7 pg (ref 26.0–34.0)
MCHC: 32.8 g/dL (ref 30.0–36.0)
MCV: 81.3 fL (ref 78.0–100.0)
PLATELETS: 328 10*3/uL (ref 150–400)
RBC: 4.16 MIL/uL (ref 3.87–5.11)
RDW: 13.9 % (ref 11.5–15.5)
WBC: 7.2 10*3/uL (ref 4.0–10.5)

## 2015-10-24 LAB — RENAL FUNCTION PANEL
ALBUMIN: 2.1 g/dL — AB (ref 3.5–5.0)
ANION GAP: 5 (ref 5–15)
BUN: 57 mg/dL — ABNORMAL HIGH (ref 6–20)
CALCIUM: 8 mg/dL — AB (ref 8.9–10.3)
CO2: 25 mmol/L (ref 22–32)
Chloride: 110 mmol/L (ref 101–111)
Creatinine, Ser: 1.33 mg/dL — ABNORMAL HIGH (ref 0.44–1.00)
GFR calc non Af Amer: 56 mL/min — ABNORMAL LOW (ref >60)
GLUCOSE: 107 mg/dL — AB (ref 65–99)
PHOSPHORUS: 2.7 mg/dL (ref 2.5–4.6)
Potassium: 4.6 mmol/L (ref 3.5–5.1)
SODIUM: 140 mmol/L (ref 135–145)

## 2015-10-24 MED ORDER — MYCOPHENOLATE SODIUM 360 MG PO TBEC: 360.0000 mg | DELAYED_RELEASE_TABLET | Freq: Two times a day (BID) | ORAL | Status: AC

## 2015-10-24 MED ORDER — MORPHINE SULFATE (PF) 2 MG/ML IV SOLN
2.0000 mg | Freq: Once | INTRAVENOUS | Status: AC
  Administered 2015-10-24: 2 mg via INTRAVENOUS
  Filled 2015-10-24: qty 1

## 2015-10-24 MED ORDER — METHYLPREDNISOLONE SODIUM SUCC 40 MG IJ SOLR: 40.0000 mg | Freq: Two times a day (BID) | INTRAMUSCULAR | Status: DC

## 2015-10-24 NOTE — Progress Notes (Addendum)
Spoke to Radiology. They said they will send CT, Ultrasound, and hydascan records [facility].

## 2015-10-24 NOTE — Progress Notes (Signed)
Report called to [facility] called to set up transport.

## 2015-10-24 NOTE — Progress Notes (Signed)
[facility] here to pick up pt and [facility] to [facility]

## 2015-10-24 NOTE — Discharge Summary (Addendum)
Physician Discharge Summary  JEIRY BIRNBAUM ZOX:[id number] DOB: 06/06/1990 DOA: 10/11/2015  PCP: Cain Saupe, MD  Admit date: 10/11/2015 Discharge date: 10/24/2015  Admitted From: Home Disposition:  Transfer to [facility]   Discharge Condition:stable CODE STATUS: FULL Diet recommendation: clears  Brief/Interim Summary: 25 year old female with no chronic medical problems presented with 2-3 day history of nausea, vomiting, and abdominal pain. The patient had seen gastroenterology in the outpatient setting and was diagnosed with GERD. She was prescribed omeprazole without improvement. Initial evaluation including CT of the abdomen and pelvis revealed wall thickening involving the small bowel and ascending colon suggestive of inflammatory enterocolitis. The patient also had ascites for which she underwent a paracentesis on 10/12/2015. SAAG was >1.1.  Cultures from the paracentesis were negative. Gastroenterology was consulted. The patient subsequently underwent small bowel enteroscopy performed by GI on 10/16/2015 which revealed a normal esophagus, stomach, and duodenum. Duodenal biopsies revealed benign small bowel mucosa. Unfortunately, the patient continued to have intractable nausea and vomiting. An NG tube was placed with some improvement. Limited abdominal ultrasound suggested a bladder sludge but no signs of cholecystitis. Follow-up HIDA scan revealed nonvisualization of the gallbladder, but GI did not feel that the patient had acute cholecystitis. Further workup revealed ANA of 1:1280 with hypocomplementemia and elevated double-stranded DNA suggestive of lupus. The patient's renal function worsened with a serum creatinine peaking at 3.11. Nephrology was consulted. The patient underwent a renal biopsy on 10/19/2015. 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 The patient was started on intravenous Solu-Medrol with some improvement of her renal function. The patient was ultimately started  on mycophenolate by nephrology on 10/23/2015. Unfortunately, the patient continued to have nausea, vomiting, and abdominal pain after the NG tube was removed. Repeat abdominal x-rays revealed continued ileus. Gastroenterology felt the patient would be best served by transfer to a tertiary care center due to her complexity and lack of improvement. The case was discussed with nephrology who also agreed.  Discharge Diagnoses:  Acute kidney injury -Secondary to focal proliferative lupus nephritis -Appreciate nephrology follow-up -Continue intravenous steroids--started on 10/17/2015--40 mg IV every 8 hours and reduced to 40 mg IV every 12 hours on 10/24/2015 (could not take po prednisone due to N/V) -appreciate nephrology -ANA 1:1280 -Suppressed complements with elevated double-stranded DNA -Continue mycophenolate per nephrology--started 10/23/2015 -serum creatinine 3.11>>>1.33 -24 hour urine protein 560 mg  Intractable nausea and vomiting -10/23/15-AXR suggests ileus -pt has loose stools and is passing flatus -case discussed with GI, Dr. Mann-->recommended transfer to tertiary care center -10/15/2015 abdominal ultrasound, GB sludge without cholecystitis -10/17/2015 HIDA--nonvisualization of the gallbladder -GI did not feel this was infectious cholecystitis -Patient has been off antibiotics greater than 1 week and remains afebrile and hemodynamically stable  SLE -ANA and ds DNA as above -ENA >8 -elevated RNP, SSA and SSB  Ascites/question enterocolitis -s/p paracentesis 2.2 L removed on 7/7--cultures neg--WBC 303 with 97% lymphs -SAAG > 1.1 -remains afebrile and hemodynamically stable off antibiotics -likely related to SLE -10/16/2015 small bowel enteroscopy--normal esophagus, stomach, duodenum--jejunal biopsy pathology with normal small bowel mucosa -Over the past 2-3 days, patient feels that there has been no reaccumulation of her ascites   Discharge Instructions      Discharge  Instructions    Diet - low sodium heart healthy    Complete by:  As directed      Increase activity slowly    Complete by:  As directed             Medication List  STOP taking these medications        cetirizine 10 MG tablet  Commonly known as:  ZYRTEC     sucralfate 1 GM/10ML suspension  Commonly known as:  CARAFATE      TAKE these medications        methylPREDNISolone sodium succinate 40 mg/mL injection  Commonly known as:  SOLU-MEDROL  Inject 1 mL (40 mg total) into the vein every 12 (twelve) hours.     mycophenolate 360 MG Tbec EC tablet  Commonly known as:  MYFORTIC  Take 1 tablet (360 mg total) by mouth 2 (two) times daily.     ondansetron 4 MG disintegrating tablet  Commonly known as:  ZOFRAN ODT  Take 1 tablet (4 mg total) by mouth every 8 (eight) hours as needed for nausea or vomiting.     ranitidine 150 MG tablet  Commonly known as:  ZANTAC  Take 1 tablet (150 mg total) by mouth 2 (two) times daily.        Allergies  Allergen Reactions  . Penicillins Hives and Other (See Comments)    Has patient had a PCN reaction causing immediate rash, facial/tongue/throat swelling, SOB or lightheadedness with hypotension: No Has patient had a PCN reaction causing severe rash involving mucus membranes or skin necrosis: No Has patient had a PCN reaction that required hospitalization Yes Has patient had a PCN reaction occurring within the last 10 years: No If all of the above answers are "NO", then may proceed with Cephalosporin use.  Marland Kitchen Omeprazole Magnesium Rash    All over body. Pt. followed up with doctor post reaction.    Consultations:  GI--Dr. Mann/[facility]   Procedures/Studies: Nm Hepatobiliary Liver Func  10/17/2015  CLINICAL DATA:  25 year old female with history of right upper quadrant abdominal pain for 1 week. Nausea and vomiting. Pain worsens with fatty meals. EXAM: NUCLEAR MEDICINE HEPATOBILIARY IMAGING TECHNIQUE: Sequential images  of the abdomen were obtained out to 60 minutes following intravenous administration of radiopharmaceutical. RADIOPHARMACEUTICALS:  5.2 mCi Tc-43m  Choletec IV COMPARISON:  No priors.  CT the abdomen and pelvis 10/11/2015. FINDINGS: Prompt uptake and biliary excretion of activity by the liver is seen. Gallbladder activity is not visualized at any point during the examination, including after administration of 3 mg of IV morphine. Biliary activity passes into small bowel, consistent with patent common bile duct. IMPRESSION: 1. Nonvisualization of the gallbladder suggestive of cystic duct obstruction. This could indicate acute cholecystitis in the appropriate clinical setting (particularly in light of extensive biliary sludge noted in the gallbladder on prior ultrasound 10/15/2015); however, there multiple causes of false positivity on these examinations, including prolonged fasting. Clinical correlation is recommended. Electronically Signed   By: Trudie Reed M.D.   On: 10/17/2015 10:05   Ct Abdomen Pelvis W Contrast  10/11/2015  ADDENDUM REPORT: 10/11/2015 18:11 ADDENDUM: These results were called by telephone at the time of interpretation on 10/11/2015 at 5:50 pm to Dr. Charna Elizabeth , who verbally acknowledged these results. Although the the patient's young age would favor a benign etiology for the findings, it should be noted that the degree of abdominopelvic ascites is unusual for infectious/inflammatory enteritis. However, pertinent negatives on this study include normal bilateral ovaries, visualized appendix, and pancreas. Consider upper endoscopy for further evaluation, as clinically warranted. Electronically Signed   By: Charline Bills M.D.   On: 10/11/2015 18:11  10/11/2015  CLINICAL DATA:  Upper abdominal pain, nausea/vomiting EXAM: CT ABDOMEN AND PELVIS WITH CONTRAST TECHNIQUE:  Multidetector CT imaging of the abdomen and pelvis was performed using the standard protocol following bolus administration  of intravenous contrast. CONTRAST:  ISOVUE-300 IOPAMIDOL (ISOVUE-300) INJECTION 61% COMPARISON:  None. FINDINGS: Lower chest: 4 mm subpleural nodule in the left lower lobe (series 4/image 5), likely benign. No dedicated follow-up imaging is required given the patient's age. Hepatobiliary: Liver is within normal limits. Gallbladder is unremarkable. No intrahepatic or extrahepatic ductal dilatation. Pancreas: Within normal limits. Spleen: 2.7 cm loculated fluid density lesion along the lateral spleen (series 3/ image 26), possibly reflecting a pseudocyst versus loculated ascites. Adrenals/Urinary Tract: Adrenal glands are within normal limits. Kidneys are within normal limits.  No hydronephrosis. Bladder is underdistended but unremarkable. Stomach/Bowel: Stomach is within normal limits. Wall thickening involving a long segments of small bowel in the mid abdomen (series 3/ images 39 and 49). Additional wall thickening involving ascending colon/cecum (coronal image 54). This appearance suggests infectious or inflammatory enterocolitis. Appendix is within normal limits (series 3/ image 64). Vascular/Lymphatic: No evidence of abdominal aortic aneurysm. No suspicious abdominopelvic lymphadenopathy. Reproductive: Uterus is within normal limits. Bilateral ovaries are within normal limits. Other: Large volume abdominopelvic ascites, mostly measuring simple fluid density, although possibly minimally complicated by hemorrhage in the dependent pelvis (series 3/image 78). Associated mild body wall edema. Musculoskeletal: Visualized osseous structures are within normal limits. IMPRESSION: Long segment small bowel wall thickening in the right mid abdomen. Additional wall thickening involving the ascending colon/cecum. This appearance suggests infectious or inflammatory enterocolitis. Large volume abdominopelvic ascites, possibly minimally complicated by hemorrhage. Additional ancillary findings as above. These results will be  called to the ordering clinician or representative by the Radiology Department at the imaging location. Electronically Signed: By: Charline Bills M.D. On: 10/11/2015 17:33   US Biopsy  10/19/2015  INDICATION: 25 year old female with a history of medical renal disease. EXAM: ULTRASOUND BIOPSY CORE LIVER MEDICATIONS: None. ANESTHESIA/SEDATION: Moderate (conscious) sedation was employed during this procedure. A total of Versed 2.0 mg and Fentanyl 100 mcg was administered intravenously. Moderate Sedation Time: 15 minutes. The patient's level of consciousness and vital signs were monitored continuously by radiology nursing throughout the procedure under my direct supervision. FLUOROSCOPY TIME:  None COMPLICATIONS: None PROCEDURE: Informed written consent was obtained from the patient after a thorough discussion of the procedural risks, benefits and alternatives. All questions were addressed. Maximal Sterile Barrier Technique was utilized including caps, mask, sterile gowns, sterile gloves, sterile drape, hand hygiene and skin antiseptic. A timeout was performed prior to the initiation of the procedure. Patient was positioned prone position on the gantry table. Images were stored sent to PACs. Once the patient is prepped and draped in the usual sterile fashion, the skin and subcutaneous tissues overlying the left kidney were generously infiltrated 1% lidocaine for local anesthesia. Using ultrasound guidance, a 15 gauge guide needle was advanced into the lower cortex of the left kidney. Once we confirmed location of the needle tip, 2 separate 16 gauge core biopsy were achieved. Two Gel-Foam pledgets were infused with a small amount of saline. The needle was removed. Final images were stored. The patient tolerated the procedure well and remained hemodynamically stable throughout. No complications were encountered and no significant blood loss encountered. IMPRESSION: Status post left medical renal biopsy, with tissue  specimen sent to pathology for complete histopathologic analysis. Signed, Yvone Neu. Loreta Ave, DO [facility] Electronically Signed   By: Gilmer Mor D.O.   On: 10/19/2015 11:49   US Paracentesis  10/12/2015  INDICATION: Several months of epigastric abdominal pain with recent CT scan revealing a a moderate amount of abdominal ascites. Patient was referred to the hospital for admission and request made for diagnostic and therapeutic paracentesis. EXAM: ULTRASOUND GUIDED DIAGNOSTIC AND THERAPEUTIC PARACENTESIS MEDICATIONS: 1% lidocaine COMPLICATIONS: None immediate. PROCEDURE: Informed written consent was obtained from the patient after a discussion of the risks, benefits and alternatives to treatment. A timeout was performed prior to the initiation of the procedure. Initial ultrasound scanning demonstrates a moderate amount of ascites within the right lower abdominal quadrant. The right lower abdomen was prepped and draped in the usual sterile fashion. 1% lidocaine was used for local anesthesia. Following this, a 19 gauge, 7-cm, Yueh catheter was introduced. An ultrasound image was saved for documentation purposes. The paracentesis was performed. The catheter was removed and a dressing was applied. The patient tolerated the procedure well without immediate post procedural complication. FINDINGS: A total of approximately 2.2 L of light serosanguineous fluid was removed. Samples were sent to the laboratory as requested by the clinical team. IMPRESSION: Successful ultrasound-guided paracentesis yielding 2.2 liters of peritoneal fluid. Read by: Barnetta Chapel, PA-C Electronically Signed   By: Jolaine Click M.D.   On: 10/12/2015 11:10   Ir US Guide Vasc Access Right  10/11/2015  INDICATION: 25 year old female with abdominal pain, and dehydration. She requires a CT scan of the abdomen and pelvis with intravenous contrast material button IV cannot be obtained  secondary to dehydration. Ultrasound-guided IV start is requested. EXAM: IR ULTRASOUND GUIDANCE VASC ACCESS RIGHT MEDICATIONS: None ANESTHESIA/SEDATION: None FLUOROSCOPY TIME:  None COMPLICATIONS: None immediate. PROCEDURE: The right arm was sterilely prepped and draped in standard fashion using chlorhexidine skin prep. A tourniquet was applied. Using sterile technique and ultrasound guidance, the right brachial vein was punctured in the mid upper arm with a 21 gauge micropuncture needle. The needle was exchanged over a micro wire for a 5 [place] transitional micro sheath. The sheath was advanced in the brachial vein. The sheath was fitted with a valves cap and flushed with sterile saline. The patient tolerated the procedure well. IMPRESSION: Successful ultrasound-guided IV start. Electronically Signed   By: Malachy Moan M.D.   On: 10/11/2015 17:17   Dg Abd 2 Views  10/23/2015  CLINICAL DATA:  Vomiting and lower abdominal pain for 3 days. EXAM: ABDOMEN - 2 VIEW COMPARISON:  Renal ultrasound 10/19/2015; abdominal radiograph 10/16/2015. FINDINGS: Mildly gaseous distended loops of small bowel are demonstrated. Gas is demonstrated within the colon. Decubitus images demonstrate no free intraperitoneal air. Regional skeleton is unremarkable. IMPRESSION: Findings most compatible with ileus. Electronically Signed   By: Annia Belt M.D.   On: 10/23/2015 10:47   Dg Abd 2 Views  10/14/2015  CLINICAL DATA:  Abdominal distention 4 days EXAM: ABDOMEN - 2 VIEW COMPARISON:  CT 10/11/2015 FINDINGS: No free air beneath hemidiaphragms. Dilated loops of small bowel up to 3.5 cm similar to comparison CT. There is gas throughout the small bowel and colon including the rectum. No pathologic calcifications IMPRESSION: 1. No intraperitoneal free air or evidence of high-grade bowel obstruction. 2. Dilated loops of small bowel in the RIGHT abdomen similar to comparison CT consistent with small bowel edema. Electronically Signed   By:  Genevive Bi M.D.   On: 10/14/2015 12:46   Dg Abd Portable 1v  10/16/2015  CLINICAL DATA:  Check nasogastric catheter placement EXAM: PORTABLE ABDOMEN - 1 VIEW COMPARISON:  None. FINDINGS: Scattered large and small bowel gas is noted.  A nasogastric catheter is coiled within the stomach. No free air is seen. No bony abnormality is noted. IMPRESSION: Nasogastric catheter within stomach. Electronically Signed   By: Alcide Clever M.D.   On: 10/16/2015 17:28   Dg Abd Portable 1v  10/15/2015  CLINICAL DATA:  Abdominal distension, tight sensation EXAM: PORTABLE ABDOMEN - 1 VIEW COMPARISON:  CT abdomen 10/11/2015, abdominal x-ray 10/14/2015 FINDINGS: There are multiple distended loops of small bowel. No significant colonic air is identified. There is no evidence of pneumoperitoneum, portal venous gas or pneumatosis. There are no pathologic calcifications along the expected course of the ureters. The osseous structures are unremarkable. IMPRESSION: Gaseous distention of small bowel without significant colonic air. Differential considerations include an ileus versus small-bowel obstruction. Electronically Signed   By: Elige Ko   On: 10/15/2015 19:05   US Abdomen Limited Ruq  10/15/2015  CLINICAL DATA:  Nausea and vomiting. Patient admitted 10/11/2015 with extensive bowel wall thickening, ascites and proteinuria. EXAM: US ABDOMEN LIMITED - RIGHT UPPER QUADRANT COMPARISON:  CT abdomen and pelvis 10/11/2015 P FINDINGS: Gallbladder: The gallbladder is filled with sludge. No stones or wall thickening are identified. Sonographer reports negative Murphy's sign. Ascites is noted. Common bile duct: Diameter: 0.4 cm Liver: No focal lesion identified. Within normal limits in parenchymal echogenicity. IMPRESSION: The gallbladder is filled with sludge. No evidence of cholecystitis. Ascites. Electronically Signed   By: Drusilla Kanner M.D.   On: 10/15/2015 16:13        Discharge Exam: Filed Vitals:   10/23/15 1500  10/24/15 0645  BP: 125/75 140/95  Pulse: 85 90  Temp: 98 F (36.7 C) 98.5 F (36.9 C)  Resp: 18 17   Filed Vitals:   10/23/15 0437 10/23/15 0809 10/23/15 1500 10/24/15 0645  BP: 119/86 127/97 125/75 140/95  Pulse: 80 88 85 90  Temp: 98.4 F (36.9 C)  98 F (36.7 C) 98.5 F (36.9 C)  TempSrc: Oral  Oral Oral  Resp: 17 18 18 17   Height:      Weight: 70.9 kg (156 lb 4.9 oz)   68.3 kg (150 lb 9.2 oz)  SpO2: 99% 99% 99% 98%    General: Pt is alert, awake, not in acute distress Cardiovascular: RRR, S1/S2 +, no rubs, no gallops Respiratory: CTA bilaterally, no wheezing, no rhonchi Abdominal: Soft, LLQ, RLQ tender without rebound bowel sounds + Extremities: no edema, no cyanosis   The results of significant diagnostics from this hospitalization (including imaging, microbiology, ancillary and laboratory) are listed below for reference.    Significant Diagnostic Studies: Nm Hepatobiliary Liver Func  10/17/2015  CLINICAL DATA:  25 year old female with history of right upper quadrant abdominal pain for 1 week. Nausea and vomiting. Pain worsens with fatty meals. EXAM: NUCLEAR MEDICINE HEPATOBILIARY IMAGING TECHNIQUE: Sequential images of the abdomen were obtained out to 60 minutes following intravenous administration of radiopharmaceutical. RADIOPHARMACEUTICALS:  5.2 mCi Tc-36m  Choletec IV COMPARISON:  No priors.  CT the abdomen and pelvis 10/11/2015. FINDINGS: Prompt uptake and biliary excretion of activity by the liver is seen. Gallbladder activity is not visualized at any point during the examination, including after administration of 3 mg of IV morphine. Biliary activity passes into small bowel, consistent with patent common bile duct. IMPRESSION: 1. Nonvisualization of the gallbladder suggestive of cystic duct obstruction. This could indicate acute cholecystitis in the appropriate clinical setting (particularly in light of extensive biliary sludge noted in the gallbladder on prior  ultrasound 10/15/2015); however, there multiple causes of false positivity  on these examinations, including prolonged fasting. Clinical correlation is recommended. Electronically Signed   By: Trudie Reed M.D.   On: 10/17/2015 10:05   Ct Abdomen Pelvis W Contrast  10/11/2015  ADDENDUM REPORT: 10/11/2015 18:11 ADDENDUM: These results were called by telephone at the time of interpretation on 10/11/2015 at 5:50 pm to Dr. Charna Elizabeth , who verbally acknowledged these results. Although the the patient's young age would favor a benign etiology for the findings, it should be noted that the degree of abdominopelvic ascites is unusual for infectious/inflammatory enteritis. However, pertinent negatives on this study include normal bilateral ovaries, visualized appendix, and pancreas. Consider upper endoscopy for further evaluation, as clinically warranted. Electronically Signed   By: Charline Bills M.D.   On: 10/11/2015 18:11  10/11/2015  CLINICAL DATA:  Upper abdominal pain, nausea/vomiting EXAM: CT ABDOMEN AND PELVIS WITH CONTRAST TECHNIQUE: Multidetector CT imaging of the abdomen and pelvis was performed using the standard protocol following bolus administration of intravenous contrast. CONTRAST:  ISOVUE-300 IOPAMIDOL (ISOVUE-300) INJECTION 61% COMPARISON:  None. FINDINGS: Lower chest: 4 mm subpleural nodule in the left lower lobe (series 4/image 5), likely benign. No dedicated follow-up imaging is required given the patient's age. Hepatobiliary: Liver is within normal limits. Gallbladder is unremarkable. No intrahepatic or extrahepatic ductal dilatation. Pancreas: Within normal limits. Spleen: 2.7 cm loculated fluid density lesion along the lateral spleen (series 3/ image 26), possibly reflecting a pseudocyst versus loculated ascites. Adrenals/Urinary Tract: Adrenal glands are within normal limits. Kidneys are within normal limits.  No hydronephrosis. Bladder is underdistended but unremarkable. Stomach/Bowel:  Stomach is within normal limits. Wall thickening involving a long segments of small bowel in the mid abdomen (series 3/ images 39 and 49). Additional wall thickening involving ascending colon/cecum (coronal image 54). This appearance suggests infectious or inflammatory enterocolitis. Appendix is within normal limits (series 3/ image 64). Vascular/Lymphatic: No evidence of abdominal aortic aneurysm. No suspicious abdominopelvic lymphadenopathy. Reproductive: Uterus is within normal limits. Bilateral ovaries are within normal limits. Other: Large volume abdominopelvic ascites, mostly measuring simple fluid density, although possibly minimally complicated by hemorrhage in the dependent pelvis (series 3/image 78). Associated mild body wall edema. Musculoskeletal: Visualized osseous structures are within normal limits. IMPRESSION: Long segment small bowel wall thickening in the right mid abdomen. Additional wall thickening involving the ascending colon/cecum. This appearance suggests infectious or inflammatory enterocolitis. Large volume abdominopelvic ascites, possibly minimally complicated by hemorrhage. Additional ancillary findings as above. These results will be called to the ordering clinician or representative by the Radiology Department at the imaging location. Electronically Signed: By: Charline Bills M.D. On: 10/11/2015 17:33   US Biopsy  10/19/2015  INDICATION: 24 year old female with a history of medical renal disease. EXAM: ULTRASOUND BIOPSY CORE LIVER MEDICATIONS: None. ANESTHESIA/SEDATION: Moderate (conscious) sedation was employed during this procedure. A total of Versed 2.0 mg and Fentanyl 100 mcg was administered intravenously. Moderate Sedation Time: 15 minutes. The patient's level of consciousness and vital signs were monitored continuously by radiology nursing throughout the procedure under my direct supervision. FLUOROSCOPY TIME:  None COMPLICATIONS: None PROCEDURE: Informed written consent  was obtained from the patient after a thorough discussion of the procedural risks, benefits and alternatives. All questions were addressed. Maximal Sterile Barrier Technique was utilized including caps, mask, sterile gowns, sterile gloves, sterile drape, hand hygiene and skin antiseptic. A timeout was performed prior to the initiation of the procedure. Patient was positioned prone position on the gantry table. Images were stored sent to PACs. Once the patient  is prepped and draped in the usual sterile fashion, the skin and subcutaneous tissues overlying the left kidney were generously infiltrated 1% lidocaine for local anesthesia. Using ultrasound guidance, a 15 gauge guide needle was advanced into the lower cortex of the left kidney. Once we confirmed location of the needle tip, 2 separate 16 gauge core biopsy were achieved. Two Gel-Foam pledgets were infused with a small amount of saline. The needle was removed. Final images were stored. The patient tolerated the procedure well and remained hemodynamically stable throughout. No complications were encountered and no significant blood loss encountered. IMPRESSION: Status post left medical renal biopsy, with tissue specimen sent to pathology for complete histopathologic analysis. Signed, Yvone Neu. Loreta Ave, DO [facility] Electronically Signed   By: Gilmer Mor D.O.   On: 10/19/2015 11:49   US Paracentesis  10/12/2015  INDICATION: Several months of epigastric abdominal pain with recent CT scan revealing a a moderate amount of abdominal ascites. Patient was referred to the hospital for admission and request made for diagnostic and therapeutic paracentesis. EXAM: ULTRASOUND GUIDED DIAGNOSTIC AND THERAPEUTIC PARACENTESIS MEDICATIONS: 1% lidocaine COMPLICATIONS: None immediate. PROCEDURE: Informed written consent was obtained from the patient after a discussion of the risks, benefits and alternatives to  treatment. A timeout was performed prior to the initiation of the procedure. Initial ultrasound scanning demonstrates a moderate amount of ascites within the right lower abdominal quadrant. The right lower abdomen was prepped and draped in the usual sterile fashion. 1% lidocaine was used for local anesthesia. Following this, a 19 gauge, 7-cm, Yueh catheter was introduced. An ultrasound image was saved for documentation purposes. The paracentesis was performed. The catheter was removed and a dressing was applied. The patient tolerated the procedure well without immediate post procedural complication. FINDINGS: A total of approximately 2.2 L of light serosanguineous fluid was removed. Samples were sent to the laboratory as requested by the clinical team. IMPRESSION: Successful ultrasound-guided paracentesis yielding 2.2 liters of peritoneal fluid. Read by: Barnetta Chapel, PA-C Electronically Signed   By: Jolaine Click M.D.   On: 10/12/2015 11:10   Ir US Guide Vasc Access Right  10/11/2015  INDICATION: 25 year old female with abdominal pain, and dehydration. She requires a CT scan of the abdomen and pelvis with intravenous contrast material button IV cannot be obtained secondary to dehydration. Ultrasound-guided IV start is requested. EXAM: IR ULTRASOUND GUIDANCE VASC ACCESS RIGHT MEDICATIONS: None ANESTHESIA/SEDATION: None FLUOROSCOPY TIME:  None COMPLICATIONS: None immediate. PROCEDURE: The right arm was sterilely prepped and draped in standard fashion using chlorhexidine skin prep. A tourniquet was applied. Using sterile technique and ultrasound guidance, the right brachial vein was punctured in the mid upper arm with a 21 gauge micropuncture needle. The needle was exchanged over a micro wire for a 5 [place] transitional micro sheath. The sheath was advanced in the brachial vein. The sheath was fitted with a valves cap and flushed with sterile saline. The patient tolerated the procedure well. IMPRESSION: Successful  ultrasound-guided IV start. Electronically Signed   By: Malachy Moan M.D.   On: 10/11/2015 17:17   Dg Abd 2 Views  10/23/2015  CLINICAL DATA:  Vomiting and lower abdominal pain for 3 days. EXAM: ABDOMEN - 2 VIEW COMPARISON:  Renal ultrasound 10/19/2015; abdominal radiograph 10/16/2015. FINDINGS: Mildly gaseous distended loops of small bowel are demonstrated. Gas is demonstrated within the colon. Decubitus images demonstrate no free intraperitoneal air. Regional skeleton is unremarkable. IMPRESSION: Findings most compatible with ileus. Electronically Signed  By: Annia Beltrew  Davis M.D.   On: 10/23/2015 10:47   Dg Abd 2 Views  10/14/2015  CLINICAL DATA:  Abdominal distention 4 days EXAM: ABDOMEN - 2 VIEW COMPARISON:  CT 10/11/2015 FINDINGS: No free air beneath hemidiaphragms. Dilated loops of small bowel up to 3.5 cm similar to comparison CT. There is gas throughout the small bowel and colon including the rectum. No pathologic calcifications IMPRESSION: 1. No intraperitoneal free air or evidence of high-grade bowel obstruction. 2. Dilated loops of small bowel in the RIGHT abdomen similar to comparison CT consistent with small bowel edema. Electronically Signed   By: Genevive BiStewart  Edmunds M.D.   On: 10/14/2015 12:46   Dg Abd Portable 1v  10/16/2015  CLINICAL DATA:  Check nasogastric catheter placement EXAM: PORTABLE ABDOMEN - 1 VIEW COMPARISON:  None. FINDINGS: Scattered large and small bowel gas is noted. A nasogastric catheter is coiled within the stomach. No free air is seen. No bony abnormality is noted. IMPRESSION: Nasogastric catheter within stomach. Electronically Signed   By: Alcide CleverMark  Lukens M.D.   On: 10/16/2015 17:28   Dg Abd Portable 1v  10/15/2015  CLINICAL DATA:  Abdominal distension, tight sensation EXAM: PORTABLE ABDOMEN - 1 VIEW COMPARISON:  CT abdomen 10/11/2015, abdominal x-ray 10/14/2015 FINDINGS: There are multiple distended loops of small bowel. No significant colonic air is identified. There is  no evidence of pneumoperitoneum, portal venous gas or pneumatosis. There are no pathologic calcifications along the expected course of the ureters. The osseous structures are unremarkable. IMPRESSION: Gaseous distention of small bowel without significant colonic air. Differential considerations include an ileus versus small-bowel obstruction. Electronically Signed   By: Elige KoHetal  Patel   On: 10/15/2015 19:05   [place] Abdomen Limited Ruq  10/15/2015  CLINICAL DATA:  Nausea and vomiting. Patient admitted 10/11/2015 with extensive bowel wall thickening, ascites and proteinuria. EXAM: US ABDOMEN LIMITED - RIGHT UPPER QUADRANT COMPARISON:  CT abdomen and pelvis 10/11/2015 P FINDINGS: Gallbladder: The gallbladder is filled with sludge. No stones or wall thickening are identified. Sonographer reports negative Murphy's sign. Ascites is noted. Common bile duct: Diameter: 0.4 cm Liver: No focal lesion identified. Within normal limits in parenchymal echogenicity. IMPRESSION: The gallbladder is filled with sludge. No evidence of cholecystitis. Ascites. Electronically Signed   By: Drusilla Kannerhomas  Dalessio M.D.   On: 10/15/2015 16:13     Microbiology: Recent Results (from the past 240 hour(s))  Surgical pcr screen     Status: None   Collection Time: 10/16/15 11:26 AM  Result Value Ref Range Status   MRSA, PCR NEGATIVE NEGATIVE Final   Staphylococcus aureus NEGATIVE NEGATIVE Final    Comment:        The Xpert SA Assay (FDA approved for NASAL specimens in patients over 24 years of age), is one component of a comprehensive surveillance program.  Test performance has been validated by [facility] for patients greater than or equal to 24 year old. It is not intended to diagnose infection nor to guide or monitor treatment.      Labs: Basic Metabolic Panel:  Recent Labs Lab 10/20/15 0352 10/21/15 0413 10/22/15 0421 10/23/15 0401 10/24/15 0240  NA 138 136 137 138 140  K 5.4* 4.8 4.5 4.8 4.6  CL 106 108 110 108  110  CO2 23 23 23 24 25   GLUCOSE 100* 113* 96 124* 107*  BUN 94* 88* 79* 64* 57*  CREATININE 2.99* 2.12* 1.77* 1.55* 1.33*  CALCIUM 8.4* 8.2* 7.7* 8.1* 8.0*  PHOS  --  4.3  3.0 3.3 2.7   Liver Function Tests:  Recent Labs Lab 10/17/15 1200 10/18/15 0527 10/21/15 0413 10/22/15 0421 10/23/15 0401 10/24/15 0240  AST 22 24  --   --   --   --   ALT 13* 16  --   --   --   --   ALKPHOS 34* 35*  --   --   --   --   BILITOT 0.8 0.5  --   --   --   --   PROT 5.1* 5.0*  --   --   --   --   ALBUMIN 2.5* 2.2* 2.1* 2.2* 2.1* 2.1*   No results for input(s): LIPASE, AMYLASE in the last 168 hours. No results for input(s): AMMONIA in the last 168 hours. CBC:  Recent Labs Lab 10/18/15 0527 10/19/15 0446 10/20/15 0352 10/24/15 0240  WBC 4.7 5.9 4.3 7.2  HGB 11.4* 9.7* 11.2* 11.1*  HCT 34.7* 28.5* 33.4* 33.8*  MCV 79.4 78.7 79.9 81.3  PLT 265 236 293 328   Cardiac Enzymes: No results for input(s): CKTOTAL, CKMB, CKMBINDEX, TROPONINI in the last 168 hours. BNP: Invalid input(s): POCBNP CBG: No results for input(s): GLUCAP in the last 168 hours.  Time coordinating discharge:  Greater than 30 minutes  Signed:  Partick Musselman, DO [facility] Pager: [phone] 10/24/2015, 11:23 AM

## 2015-10-24 NOTE — Progress Notes (Signed)
Patient ID: Caroline Elliott, female   DOB: 1990/08/17, 25 y.o.   MRN: [id number] S: feels a little better but still with lower abdominal cramping O:BP 140/95 mmHg  Pulse 90  Temp(Src) 98.5 F (36.9 C) (Oral)  Resp 17  Ht  (1.626 m)  Wt 68.3 kg (150 lb 9.2 oz)  BMI 25.83 kg/m2  SpO2 98%  LMP 09/30/2015  Intake/Output Summary (Last 24 hours) at 10/24/15 0853 Last data filed at 10/24/15 0816  Gross per 24 hour  Intake   1360 ml  Output    500 ml  Net    860 ml   Intake/Output: I/O last 3 completed shifts: In: 1810 [P.O.:810; I.V.:1000] Out: 750 [Urine:750]  Intake/Output this shift:  Total I/O In: -  Out: 500 [Urine:500] Weight change: -2.6 kg (-5 lb 11.7 oz) Gen:wd wn aaf in mild distress CVS:no rub Resp:cta WUJ:[id number], +fluid wave, tenderness in suprapubic region, no guarding/rebound Ext:no edema   Recent Labs Lab 10/17/15 1200 10/18/15 0527 10/19/15 0446 10/20/15 0352 10/21/15 0413 10/22/15 0421 2015/11/07 0401 10/24/15 0240  NA 134* 136 134* 138 136 137 138 140  K 5.1 5.4* 5.0 5.4* 4.8 4.5 4.8 4.6  CL 106 106 102 106 108 110 108 110  CO2 21* 21* 21* GLUCOSE 96 101* 106* 100* 113* 96 124* 107*  BUN 63* 71* 84* 94* 88* 79* 64* 57*  CREATININE 3.22* 3.52* 3.67* 2.99* 2.12* 1.77* 1.55* 1.33*  ALBUMIN 2.5* 2.2*  --   --  2.1* 2.2* 2.1* 2.1*  CALCIUM 8.1* 8.2* 8.0* 8.4* 8.2* 7.7* 8.1* 8.0*  PHOS  --   --   --   --  4.3 3.0 3.3 2.7  AST 22 24  --   --   --   --   --   --   ALT 13* 16  --   --   --   --   --   --    Liver Function Tests:  Recent Labs Lab 10/17/15 1200 10/18/15 0527  10/22/15 0421 07-Nov-2015 0401 10/24/15 0240  AST 22 24  --   --   --   --   ALT 13* 16  --   --   --   --   ALKPHOS 34* 35*  --   --   --   --   BILITOT 0.8 0.5  --   --   --   --   PROT 5.1* 5.0*  --   --   --   --   ALBUMIN 2.5* 2.2*  < > 2.2* 2.1* 2.1*  < > = values in this interval not displayed. No results for input(s): LIPASE, AMYLASE in the last  168 hours. No results for input(s): AMMONIA in the last 168 hours. CBC:  Recent Labs Lab 10/18/15 0527 10/19/15 0446 10/20/15 0352 10/24/15 0240  WBC 4.7 5.9 4.3 7.2  HGB 11.4* 9.7* 11.2* 11.1*  HCT 34.7* 28.5* 33.4* 33.8*  MCV 79.4 78.7 79.9 81.3  PLT 265 236 293 328   Cardiac Enzymes: No results for input(s): CKTOTAL, CKMB, CKMBINDEX, TROPONINI in the last 168 hours. CBG: No results for input(s): GLUCAP in the last 168 hours.  Iron Studies: No results for input(s): IRON, TIBC, TRANSFERRIN, FERRITIN in the last 72 hours. Studies/Results: Dg Abd 2 Views  11/07/2015  CLINICAL DATA:  Vomiting and lower abdominal pain for 3 days. EXAM: ABDOMEN - 2 VIEW COMPARISON:  Renal ultrasound 10/19/2015; abdominal radiograph  10/16/2015. FINDINGS: Mildly gaseous distended loops of small bowel are demonstrated. Gas is demonstrated within the colon. Decubitus images demonstrate no free intraperitoneal air. Regional skeleton is unremarkable. IMPRESSION: Findings most compatible with ileus. Electronically Signed   By: Annia Beltrew  Davis M.D.   On: 10/23/2015 10:47   . famotidine  20 mg Oral Daily  . feeding supplement  1 Container Oral TID BM  . heparin subcutaneous  5,000 Units Subcutaneous Q8H  . methylPREDNISolone (SOLU-MEDROL) injection  40 mg Intravenous Q12H  . mycophenolate  360 mg Oral BID    BMET    Component Value Date/Time   NA 140 10/24/2015 0240   K 4.6 10/24/2015 0240   CL 110 10/24/2015 0240   CO2 25 10/24/2015 0240   GLUCOSE 107* 10/24/2015 0240   BUN 57* 10/24/2015 0240   CREATININE 1.33* 10/24/2015 0240   CALCIUM 8.0* 10/24/2015 0240   GFRNONAA 56* 10/24/2015 0240   GFRAA >60 10/24/2015 0240   CBC    Component Value Date/Time   WBC 7.2 10/24/2015 0240   RBC 4.16 10/24/2015 0240   HGB 11.1* 10/24/2015 0240   HCT 33.8* 10/24/2015 0240   PLT 328 10/24/2015 0240   MCV 81.3 10/24/2015 0240   MCH 26.7 10/24/2015 0240   MCHC 32.8 10/24/2015 0240   RDW 13.9 10/24/2015 0240    LYMPHSABS 0.6* 10/15/2015 1822   MONOABS 0.2 10/15/2015 1822   EOSABS 0.0 10/15/2015 1822   BASOSABS 0.0 10/15/2015 1822     Assessment/Plan:  1. AKI- presumably related to contrast exposure as well as superimposed ATN due to paracentesis. Scr rose from 0.94 on 10/09/15 and has continued to climb. W/u revealed +ANA and pt with h/o lupus. 1. S/p renal biopsy 10/19/15 which revealed some ATN as well as focal proliferative lupus nephritis 2. Scr continues to improve with only prednisone 3. Stopped solumedrol 10/21/15 and started prednisone 40mg  daily  4. Discussed case with Dr. Marisue HumbleSanford and best option is to try myfortic 360mg  bid (cellcept likely to cause worsening GI symptoms) over cytoxan given her age and concern over fertility.(myfortic was started 10/23/15 in the evening and N/V/D preceded medication administration so not due to myfortic) and seems to be tolerating myfortic. 5. Will need to f/u with Dr. Marisue HumbleSanford at our office on 11/02/15 at 10:30 am at [address] to f/u biopsy results 2. Ascites with low serum albumin- unclear etiology. W/u underway. GI following 3. Hyperkalemia- due to AKI. Renal diet and will give dose of kayexalate and recheck in am 4. Bowel wall thickening 5. Protein malnutrition 6. SLE 7. Ileus 8. Disposition- per primary svc. Awaiting GI input. f/u with Dr Marisue HumbleSanford on 11/02/15 as above.  Will sign off for now.  Please call with any questions or concerns.   Julien NordmannJoseph A Oiva Dibari

## 2015-10-24 NOTE — Progress Notes (Signed)
Pt. Refusing IV fluids.

## 2015-10-26 ENCOUNTER — Encounter (HOSPITAL_COMMUNITY): Payer: Self-pay

## 2016-05-28 DIAGNOSIS — Z23 Encounter for immunization: Secondary | ICD-10-CM | POA: Diagnosis not present

## 2016-05-28 DIAGNOSIS — Z79899 Other long term (current) drug therapy: Secondary | ICD-10-CM | POA: Diagnosis not present

## 2016-05-28 DIAGNOSIS — M3214 Glomerular disease in systemic lupus erythematosus: Secondary | ICD-10-CM | POA: Diagnosis not present

## 2016-06-22 DIAGNOSIS — S61228D Laceration with foreign body of other finger without damage to nail, subsequent encounter: Secondary | ICD-10-CM | POA: Diagnosis not present

## 2016-07-14 DIAGNOSIS — R102 Pelvic and perineal pain: Secondary | ICD-10-CM | POA: Diagnosis not present

## 2016-07-16 DIAGNOSIS — R809 Proteinuria, unspecified: Secondary | ICD-10-CM | POA: Diagnosis not present

## 2016-07-16 DIAGNOSIS — I129 Hypertensive chronic kidney disease with stage 1 through stage 4 chronic kidney disease, or unspecified chronic kidney disease: Secondary | ICD-10-CM | POA: Diagnosis not present

## 2016-07-16 DIAGNOSIS — M3214 Glomerular disease in systemic lupus erythematosus: Secondary | ICD-10-CM | POA: Diagnosis not present

## 2016-09-08 DIAGNOSIS — M329 Systemic lupus erythematosus, unspecified: Secondary | ICD-10-CM | POA: Diagnosis not present

## 2016-09-08 DIAGNOSIS — M3214 Glomerular disease in systemic lupus erythematosus: Secondary | ICD-10-CM | POA: Diagnosis not present

## 2016-10-20 DIAGNOSIS — J3489 Other specified disorders of nose and nasal sinuses: Secondary | ICD-10-CM | POA: Diagnosis not present

## 2016-10-20 DIAGNOSIS — M329 Systemic lupus erythematosus, unspecified: Secondary | ICD-10-CM | POA: Diagnosis not present

## 2016-10-20 DIAGNOSIS — M3214 Glomerular disease in systemic lupus erythematosus: Secondary | ICD-10-CM | POA: Diagnosis not present

## 2016-12-24 DIAGNOSIS — M329 Systemic lupus erythematosus, unspecified: Secondary | ICD-10-CM | POA: Diagnosis not present

## 2016-12-24 DIAGNOSIS — M3214 Glomerular disease in systemic lupus erythematosus: Secondary | ICD-10-CM | POA: Diagnosis not present

## 2017-01-10 ENCOUNTER — Emergency Department (HOSPITAL_COMMUNITY)
Admission: EM | Admit: 2017-01-10 | Discharge: 2017-01-10 | Disposition: A | Discharge status: Home / Self Care | Payer: 59 | Attending: Emergency Medicine | Admitting: Emergency Medicine

## 2017-01-10 DIAGNOSIS — Y92410 Unspecified street and highway as the place of occurrence of the external cause: Secondary | ICD-10-CM | POA: Diagnosis not present

## 2017-01-10 DIAGNOSIS — Y998 Other external cause status: Secondary | ICD-10-CM | POA: Insufficient documentation

## 2017-01-10 DIAGNOSIS — M545 Low back pain, unspecified: Secondary | ICD-10-CM

## 2017-01-10 DIAGNOSIS — M25511 Pain in right shoulder: Secondary | ICD-10-CM

## 2017-01-10 DIAGNOSIS — M3210 Systemic lupus erythematosus, organ or system involvement unspecified: Secondary | ICD-10-CM | POA: Insufficient documentation

## 2017-01-10 DIAGNOSIS — Y939 Activity, unspecified: Secondary | ICD-10-CM | POA: Diagnosis not present

## 2017-01-10 LAB — POC URINE PREG, ED: Preg Test, Ur: NEGATIVE

## 2017-01-10 MED ORDER — DICLOFENAC SODIUM 3 % TD GEL: 1.0000 "application " | Freq: Two times a day (BID) | TRANSDERMAL | 0 refills | Status: AC

## 2017-01-10 MED ORDER — ACETAMINOPHEN 325 MG PO TABS
650.0000 mg | ORAL_TABLET | Freq: Once | ORAL | Status: AC
  Administered 2017-01-10: 650 mg via ORAL
  Filled 2017-01-10: qty 2

## 2017-01-10 MED ORDER — ACETAMINOPHEN 325 MG PO TABS: 650.0000 mg | ORAL_TABLET | Freq: Four times a day (QID) | ORAL | 0 refills | Status: AC | PRN
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
## 2017-01-10 NOTE — ED Notes (Signed)
Declined W/C at D/C and was escorted to lobby by RN. 
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
## 2017-01-16 DIAGNOSIS — I129 Hypertensive chronic kidney disease with stage 1 through stage 4 chronic kidney disease, or unspecified chronic kidney disease: Secondary | ICD-10-CM | POA: Diagnosis not present

## 2017-01-16 DIAGNOSIS — M3214 Glomerular disease in systemic lupus erythematosus: Secondary | ICD-10-CM | POA: Diagnosis not present

## 2017-03-02 DIAGNOSIS — H04123 Dry eye syndrome of bilateral lacrimal glands: Secondary | ICD-10-CM | POA: Diagnosis not present

## 2017-03-02 DIAGNOSIS — H40033 Anatomical narrow angle, bilateral: Secondary | ICD-10-CM | POA: Diagnosis not present

## 2017-03-05 DIAGNOSIS — M3214 Glomerular disease in systemic lupus erythematosus: Secondary | ICD-10-CM | POA: Diagnosis not present

## 2017-03-05 DIAGNOSIS — R809 Proteinuria, unspecified: Secondary | ICD-10-CM | POA: Diagnosis not present

## 2017-03-05 DIAGNOSIS — I129 Hypertensive chronic kidney disease with stage 1 through stage 4 chronic kidney disease, or unspecified chronic kidney disease: Secondary | ICD-10-CM | POA: Diagnosis not present

## 2017-03-25 DIAGNOSIS — Z79899 Other long term (current) drug therapy: Secondary | ICD-10-CM | POA: Diagnosis not present

## 2017-03-25 DIAGNOSIS — M3214 Glomerular disease in systemic lupus erythematosus: Secondary | ICD-10-CM | POA: Diagnosis not present

## 2017-03-25 DIAGNOSIS — M329 Systemic lupus erythematosus, unspecified: Secondary | ICD-10-CM | POA: Diagnosis not present

## 2017-03-26 DIAGNOSIS — Z118 Encounter for screening for other infectious and parasitic diseases: Secondary | ICD-10-CM | POA: Diagnosis not present

## 2017-03-26 DIAGNOSIS — Z01419 Encounter for gynecological examination (general) (routine) without abnormal findings: Secondary | ICD-10-CM | POA: Diagnosis not present

## 2017-04-22 DIAGNOSIS — I129 Hypertensive chronic kidney disease with stage 1 through stage 4 chronic kidney disease, or unspecified chronic kidney disease: Secondary | ICD-10-CM | POA: Diagnosis not present

## 2017-04-22 DIAGNOSIS — M3214 Glomerular disease in systemic lupus erythematosus: Secondary | ICD-10-CM | POA: Diagnosis not present

## 2017-05-19 ENCOUNTER — Other Ambulatory Visit: Payer: Self-pay | Admitting: Internal Medicine

## 2017-05-19 DIAGNOSIS — M3214 Glomerular disease in systemic lupus erythematosus: Secondary | ICD-10-CM | POA: Diagnosis not present

## 2017-05-19 DIAGNOSIS — R599 Enlarged lymph nodes, unspecified: Secondary | ICD-10-CM

## 2017-05-19 DIAGNOSIS — R591 Generalized enlarged lymph nodes: Secondary | ICD-10-CM | POA: Diagnosis not present

## 2017-05-19 DIAGNOSIS — R21 Rash and other nonspecific skin eruption: Secondary | ICD-10-CM | POA: Diagnosis not present

## 2017-05-24 ENCOUNTER — Other Ambulatory Visit: Payer: Self-pay

## 2017-05-24 ENCOUNTER — Emergency Department (HOSPITAL_COMMUNITY)
Admission: EM | Admit: 2017-05-24 | Discharge: 2017-05-24 | Disposition: A | Discharge status: Home / Self Care | Payer: 59 | Attending: Emergency Medicine | Admitting: Emergency Medicine

## 2017-05-24 ENCOUNTER — Emergency Department (HOSPITAL_COMMUNITY): Payer: 59

## 2017-05-24 ENCOUNTER — Encounter (HOSPITAL_COMMUNITY): Payer: Self-pay

## 2017-05-24 DIAGNOSIS — Z79899 Other long term (current) drug therapy: Secondary | ICD-10-CM | POA: Diagnosis not present

## 2017-05-24 DIAGNOSIS — M3210 Systemic lupus erythematosus, organ or system involvement unspecified: Secondary | ICD-10-CM | POA: Insufficient documentation

## 2017-05-24 DIAGNOSIS — R079 Chest pain, unspecified: Secondary | ICD-10-CM

## 2017-05-24 DIAGNOSIS — S299XXA Unspecified injury of thorax, initial encounter: Secondary | ICD-10-CM | POA: Diagnosis not present

## 2017-05-24 DIAGNOSIS — R0602 Shortness of breath: Secondary | ICD-10-CM | POA: Insufficient documentation

## 2017-05-24 DIAGNOSIS — L539 Erythematous condition, unspecified: Secondary | ICD-10-CM | POA: Diagnosis not present

## 2017-05-24 DIAGNOSIS — J029 Acute pharyngitis, unspecified: Secondary | ICD-10-CM | POA: Diagnosis not present

## 2017-05-24 DIAGNOSIS — R000 Tachycardia, unspecified: Secondary | ICD-10-CM | POA: Diagnosis not present

## 2017-05-24 DIAGNOSIS — L930 Discoid lupus erythematosus: Secondary | ICD-10-CM

## 2017-05-24 DIAGNOSIS — R0789 Other chest pain: Secondary | ICD-10-CM | POA: Diagnosis not present

## 2017-05-24 HISTORY — DX: Systemic lupus erythematosus, unspecified: M32.9

## 2017-05-24 LAB — BASIC METABOLIC PANEL
ANION GAP: 9 (ref 5–15)
BUN: 14 mg/dL (ref 6–20)
CHLORIDE: 108 mmol/L (ref 101–111)
CO2: 19 mmol/L — AB (ref 22–32)
Calcium: 8.5 mg/dL — ABNORMAL LOW (ref 8.9–10.3)
Creatinine, Ser: 0.82 mg/dL (ref 0.44–1.00)
GFR calc non Af Amer: >60 mL/min (ref >60)
GLUCOSE: 90 mg/dL (ref 65–99)
Potassium: 4 mmol/L (ref 3.5–5.1)
Sodium: 136 mmol/L (ref 135–145)

## 2017-05-24 LAB — CBC
HCT: 32.4 % — ABNORMAL LOW (ref 36.0–46.0)
HEMOGLOBIN: 10.7 g/dL — AB (ref 12.0–15.0)
MCH: 27.4 pg (ref 26.0–34.0)
MCHC: 33 g/dL (ref 30.0–36.0)
MCV: 82.9 fL (ref 78.0–100.0)
Platelets: 295 10*3/uL (ref 150–400)
RBC: 3.91 MIL/uL (ref 3.87–5.11)
RDW: 14 % (ref 11.5–15.5)
WBC: 6.7 10*3/uL (ref 4.0–10.5)

## 2017-05-24 LAB — I-STAT BETA HCG BLOOD, ED (MC, WL, AP ONLY): I-stat hCG, quantitative: <5 m[IU]/mL (ref <5)

## 2017-05-24 LAB — I-STAT TROPONIN, ED: Troponin i, poc: 0 ng/mL (ref 0.00–0.08)

## 2017-05-24 MED ORDER — IOPAMIDOL (ISOVUE-370) INJECTION 76%
INTRAVENOUS | Status: AC
  Administered 2017-05-24: 100 mL
  Filled 2017-05-24: qty 100

## 2017-05-24 MED ORDER — TRAMADOL HCL 50 MG PO TABS
50.0000 mg | ORAL_TABLET | Freq: Four times a day (QID) | ORAL | 0 refills | Status: AC | PRN
Start: 2017-05-24 — End: ?

## 2017-05-24 MED ORDER — PREDNISONE 20 MG PO TABS: 60.0000 mg | ORAL_TABLET | Freq: Every day | ORAL | 0 refills | Status: DC
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
## 2017-05-24 NOTE — ED Notes (Signed)
Pt verbalized understanding discharge instructions and denies any further needs or questions at this time. VS stable, ambulatory and steady gait.   

## 2017-05-24 NOTE — Discharge Instructions (Signed)
Follow-up with your rheumatologist and with cardiology.

## 2017-05-24 NOTE — ED Triage Notes (Signed)
Patient complains of constant CP since Thursday, reports that the pain is worse when she lays flat. No cold no cough, NAD

## 2017-05-27 DIAGNOSIS — M3212 Pericarditis in systemic lupus erythematosus: Secondary | ICD-10-CM | POA: Diagnosis not present

## 2017-05-27 DIAGNOSIS — M329 Systemic lupus erythematosus, unspecified: Secondary | ICD-10-CM | POA: Diagnosis not present

## 2017-05-27 DIAGNOSIS — M3214 Glomerular disease in systemic lupus erythematosus: Secondary | ICD-10-CM | POA: Diagnosis not present

## 2017-06-04 ENCOUNTER — Other Ambulatory Visit: Payer: 59

## 2017-06-24 DIAGNOSIS — M329 Systemic lupus erythematosus, unspecified: Secondary | ICD-10-CM | POA: Diagnosis not present

## 2017-06-24 DIAGNOSIS — M3212 Pericarditis in systemic lupus erythematosus: Secondary | ICD-10-CM | POA: Diagnosis not present

## 2017-06-24 DIAGNOSIS — M3214 Glomerular disease in systemic lupus erythematosus: Secondary | ICD-10-CM | POA: Diagnosis not present

## 2017-09-24 DIAGNOSIS — M3214 Glomerular disease in systemic lupus erythematosus: Secondary | ICD-10-CM | POA: Diagnosis not present

## 2017-09-24 DIAGNOSIS — Z79899 Other long term (current) drug therapy: Secondary | ICD-10-CM | POA: Diagnosis not present

## 2017-09-24 DIAGNOSIS — M329 Systemic lupus erythematosus, unspecified: Secondary | ICD-10-CM | POA: Diagnosis not present

## 2017-09-24 DIAGNOSIS — M3212 Pericarditis in systemic lupus erythematosus: Secondary | ICD-10-CM | POA: Diagnosis not present

## 2017-09-25 ENCOUNTER — Ambulatory Visit: Payer: 59 | Admitting: Cardiovascular Disease

## 2017-10-07 ENCOUNTER — Ambulatory Visit
Admission: RE | Admit: 2017-10-07 | Discharge: 2017-10-07 | Disposition: A | Discharge status: Home / Self Care | Payer: 59 | Source: Ambulatory Visit | Attending: Internal Medicine | Admitting: Internal Medicine

## 2017-10-07 ENCOUNTER — Other Ambulatory Visit: Payer: Self-pay | Admitting: Internal Medicine

## 2017-10-07 DIAGNOSIS — R911 Solitary pulmonary nodule: Secondary | ICD-10-CM

## 2017-10-07 DIAGNOSIS — K5900 Constipation, unspecified: Secondary | ICD-10-CM | POA: Diagnosis not present

## 2017-10-07 DIAGNOSIS — R1084 Generalized abdominal pain: Secondary | ICD-10-CM

## 2017-10-07 DIAGNOSIS — E785 Hyperlipidemia, unspecified: Secondary | ICD-10-CM | POA: Diagnosis not present

## 2017-10-07 DIAGNOSIS — R140 Abdominal distension (gaseous): Secondary | ICD-10-CM | POA: Diagnosis not present

## 2017-10-07 DIAGNOSIS — D649 Anemia, unspecified: Secondary | ICD-10-CM | POA: Diagnosis not present

## 2017-10-07 DIAGNOSIS — K5901 Slow transit constipation: Secondary | ICD-10-CM | POA: Diagnosis not present

## 2017-10-21 ENCOUNTER — Ambulatory Visit
Admission: RE | Admit: 2017-10-21 | Discharge: 2017-10-21 | Disposition: A | Discharge status: Home / Self Care | Payer: 59 | Source: Ambulatory Visit | Attending: Internal Medicine | Admitting: Internal Medicine

## 2017-10-21 ENCOUNTER — Observation Stay (HOSPITAL_COMMUNITY)
Admission: EM | Admit: 2017-10-21 | Discharge: 2017-10-22 | Disposition: A | Discharge status: Home / Self Care | Payer: 59 | Attending: Family Medicine | Admitting: Family Medicine

## 2017-10-21 ENCOUNTER — Other Ambulatory Visit: Payer: Self-pay

## 2017-10-21 ENCOUNTER — Emergency Department (HOSPITAL_BASED_OUTPATIENT_CLINIC_OR_DEPARTMENT_OTHER): Payer: 59

## 2017-10-21 ENCOUNTER — Encounter (HOSPITAL_COMMUNITY): Payer: Self-pay

## 2017-10-21 DIAGNOSIS — K5900 Constipation, unspecified: Secondary | ICD-10-CM | POA: Diagnosis not present

## 2017-10-21 DIAGNOSIS — R911 Solitary pulmonary nodule: Secondary | ICD-10-CM | POA: Diagnosis not present

## 2017-10-21 DIAGNOSIS — M3212 Pericarditis in systemic lupus erythematosus: Secondary | ICD-10-CM

## 2017-10-21 DIAGNOSIS — D649 Anemia, unspecified: Secondary | ICD-10-CM | POA: Diagnosis not present

## 2017-10-21 DIAGNOSIS — I313 Pericardial effusion (noninflammatory): Secondary | ICD-10-CM

## 2017-10-21 DIAGNOSIS — M3214 Glomerular disease in systemic lupus erythematosus: Secondary | ICD-10-CM | POA: Diagnosis not present

## 2017-10-21 DIAGNOSIS — Z7952 Long term (current) use of systemic steroids: Secondary | ICD-10-CM | POA: Insufficient documentation

## 2017-10-21 DIAGNOSIS — M329 Systemic lupus erythematosus, unspecified: Secondary | ICD-10-CM

## 2017-10-21 DIAGNOSIS — I3139 Other pericardial effusion (noninflammatory): Secondary | ICD-10-CM

## 2017-10-21 DIAGNOSIS — R9389 Abnormal findings on diagnostic imaging of other specified body structures: Secondary | ICD-10-CM | POA: Diagnosis present

## 2017-10-21 LAB — BASIC METABOLIC PANEL
Anion gap: 5 (ref 5–15)
BUN: 19 mg/dL (ref 6–20)
CHLORIDE: 113 mmol/L — AB (ref 98–111)
CO2: 22 mmol/L (ref 22–32)
CREATININE: 0.75 mg/dL (ref 0.44–1.00)
Calcium: 7.9 mg/dL — ABNORMAL LOW (ref 8.9–10.3)
GFR calc Af Amer: >60 mL/min (ref >60)
GFR calc non Af Amer: >60 mL/min (ref >60)
Glucose, Bld: 81 mg/dL (ref 70–99)
POTASSIUM: 3.7 mmol/L (ref 3.5–5.1)
Sodium: 140 mmol/L (ref 135–145)

## 2017-10-21 LAB — CBC
HEMATOCRIT: 28.3 % — AB (ref 36.0–46.0)
HEMOGLOBIN: 8.9 g/dL — AB (ref 12.0–15.0)
MCH: 26.8 pg (ref 26.0–34.0)
MCHC: 31.4 g/dL (ref 30.0–36.0)
MCV: 85.2 fL (ref 78.0–100.0)
PLATELETS: 251 10*3/uL (ref 150–400)
RBC: 3.32 MIL/uL — AB (ref 3.87–5.11)
RDW: 14.2 % (ref 11.5–15.5)
WBC: 3.9 10*3/uL — ABNORMAL LOW (ref 4.0–10.5)

## 2017-10-21 LAB — SEDIMENTATION RATE: SED RATE: 85 mm/h — AB (ref 0–22)

## 2017-10-21 LAB — ECHOCARDIOGRAM COMPLETE
HEIGHTINCHES: 64 in
WEIGHTICAEL: 2640 [oz_av]

## 2017-10-21 LAB — I-STAT TROPONIN, ED: Troponin i, poc: 0 ng/mL (ref 0.00–0.08)

## 2017-10-21 LAB — I-STAT BETA HCG BLOOD, ED (MC, WL, AP ONLY)

## 2017-10-21 MED ORDER — PREDNISONE 20 MG PO TABS: 60.0000 mg | ORAL_TABLET | Freq: Every day | ORAL | Status: DC

## 2017-10-21 MED ORDER — SODIUM CHLORIDE 0.9 % IV SOLN: 250.0000 mL | INTRAVENOUS | Status: DC | PRN

## 2017-10-21 MED ORDER — SODIUM CHLORIDE 0.9% FLUSH
3.0000 mL | Freq: Two times a day (BID) | INTRAVENOUS | Status: DC
  Administered 2017-10-21 – 2017-10-22 (×2): 3 mL via INTRAVENOUS

## 2017-10-21 MED ORDER — MAGNESIUM CITRATE PO SOLN: 1.0000 | Freq: Once | ORAL | Status: DC | PRN

## 2017-10-21 MED ORDER — SODIUM CHLORIDE 0.9% FLUSH: 3.0000 mL | INTRAVENOUS | Status: DC | PRN

## 2017-10-21 MED ORDER — ONDANSETRON HCL 4 MG PO TABS
4.0000 mg | ORAL_TABLET | Freq: Four times a day (QID) | ORAL | Status: DC | PRN
Start: 2017-10-21 — End: 2017-10-22

## 2017-10-21 MED ORDER — COLCHICINE 0.6 MG PO TABS
0.6000 mg | ORAL_TABLET | Freq: Two times a day (BID) | ORAL | Status: DC
  Administered 2017-10-21 – 2017-10-22 (×2): 0.6 mg via ORAL
  Filled 2017-10-21 (×2): qty 1

## 2017-10-21 MED ORDER — ACETAMINOPHEN 650 MG RE SUPP: 650.0000 mg | Freq: Four times a day (QID) | RECTAL | Status: DC | PRN

## 2017-10-21 MED ORDER — HYDROXYCHLOROQUINE SULFATE 200 MG PO TABS
400.0000 mg | ORAL_TABLET | Freq: Every day | ORAL | Status: DC
  Administered 2017-10-22: 400 mg via ORAL
  Filled 2017-10-21: qty 2

## 2017-10-21 MED ORDER — MYCOPHENOLATE SODIUM 180 MG PO TBEC
720.0000 mg | DELAYED_RELEASE_TABLET | Freq: Two times a day (BID) | ORAL | Status: DC
  Administered 2017-10-21: 360 mg via ORAL
  Administered 2017-10-22: 720 mg via ORAL
  Filled 2017-10-21 (×2): qty 4

## 2017-10-21 MED ORDER — DICLOFENAC SODIUM 3 % TD GEL: 1.0000 "application " | Freq: Two times a day (BID) | TRANSDERMAL | Status: DC

## 2017-10-21 MED ORDER — TRAMADOL HCL 50 MG PO TABS: 50.0000 mg | ORAL_TABLET | Freq: Four times a day (QID) | ORAL | Status: DC | PRN

## 2017-10-21 MED ORDER — ACETAMINOPHEN 325 MG PO TABS: 650.0000 mg | ORAL_TABLET | Freq: Four times a day (QID) | ORAL | Status: DC | PRN

## 2017-10-21 MED ORDER — PREDNISONE 5 MG PO TABS
5.0000 mg | ORAL_TABLET | Freq: Every day | ORAL | Status: DC
  Administered 2017-10-22: 5 mg via ORAL
  Filled 2017-10-21: qty 1

## 2017-10-21 MED ORDER — NAPROXEN 250 MG PO TABS: 500.0000 mg | ORAL_TABLET | Freq: Every day | ORAL | Status: DC | PRN

## 2017-10-21 MED ORDER — DOCUSATE SODIUM 100 MG PO CAPS
100.0000 mg | ORAL_CAPSULE | Freq: Two times a day (BID) | ORAL | Status: DC
  Administered 2017-10-21 – 2017-10-22 (×2): 100 mg via ORAL
  Filled 2017-10-21 (×2): qty 1

## 2017-10-21 MED ORDER — ONDANSETRON HCL 4 MG/2ML IJ SOLN: 4.0000 mg | Freq: Four times a day (QID) | INTRAMUSCULAR | Status: DC | PRN

## 2017-10-21 NOTE — ED Provider Notes (Signed)
[facility] Provider Note   CSN: [id number] Arrival date & time: 10/21/17  1246     History   Chief Complaint Chief Complaint  Patient presents with  . abnormal ct scan of chest    HPI Caroline Elliott is a 27 y.o. female.  Patient with known history of SLE presents with chest pain when lying flat.  Previous chest x-ray revealed a left lower lung nodule and the patient was seen by her primary care doctor and a CT scan of the chest was ordered.  CT revealed a significant pericardial effusion.  Patient was then encouraged to go to the emergency department for further treatment.  Severity of symptoms is moderate.  Nothing makes symptoms better or worse.     Past Medical History:  Diagnosis Date  . Ascites 10/11/2015 hospitalized  . Lupus (HCC)   . Seasonal allergies     Patient Active Problem List   Diagnosis Date Noted  . 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 AKI (acute kidney injury) (HCC)   . Abdominal distension   . Abdominal pain, epigastric 10/12/2015  . Enterocolitis 10/12/2015  . Nausea and vomiting 10/12/2015  . Lymphadenopathy 10/12/2015  . Acute kidney injury (HCC) 10/12/2015  . Ascites 10/11/2015  . Seasonal allergies 08/23/2015  . BMI 27.0-27.9,adult 08/23/2015  . GERD (gastroesophageal reflux disease) 08/23/2015    Past Surgical History:  Procedure Laterality Date  . ENTEROSCOPY N/A 10/16/2015   Procedure: ENTEROSCOPY;  Surgeon: Jeani HawkingPatrick Hung, MD;  [facility];  Service: Endoscopy;  Laterality: N/A;  . NO PAST SURGERIES       OB History   None      Home Medications    Prior to Admission medications   Medication Sig Start Date End Date Taking? Authorizing Provider  acetaminophen (TYLENOL) 325 MG tablet Take 2 tablets (650 mg total) by mouth every 6 (six) hours as needed for mild pain or moderate pain. Patient not taking: Reported on 05/24/2017 01/10/17   Everlene Farrieransie, William, PA-C  Diclofenac Sodium 3 %  GEL Place 1 application onto the skin 2 (two) times daily. To affected area. 01/10/17   Everlene Farrieransie, William, PA-C  hydroxychloroquine (PLAQUENIL) 200 MG tablet Take 400 mg by mouth daily.    [provider]  methylPREDNISolone sodium succinate (SOLU-MEDROL) 40 mg/mL injection Inject 1 mL (40 mg total) into the vein every 12 (twelve) hours. Patient not taking: Reported on 05/24/2017 10/24/15   Catarina Hartshornat, David, MD  mycophenolate (MYFORTIC) 360 MG TBEC EC tablet Take 1 tablet (360 mg total) by mouth 2 (two) times daily. Patient taking differently: Take 1,440 mg by mouth 2 (two) times daily.  10/24/15   Catarina Hartshornat, David, MD  naproxen (NAPROSYN) 500 MG tablet Take 500 mg by mouth daily as needed for mild pain.    [provider]  ondansetron (ZOFRAN ODT) 4 MG disintegrating tablet Take 1 tablet (4 mg total) by mouth every 8 (eight) hours as needed for nausea or vomiting. 10/09/15   Dowless, Lelon MastSamantha Tripp, PA-C  predniSONE (DELTASONE) 20 MG tablet Take 3 tablets (60 mg total) by mouth daily. 05/24/17   Benjiman CorePickering, Nathan, MD  ranitidine (ZANTAC) 150 MG tablet Take 1 tablet (150 mg total) by mouth 2 (two) times daily. Patient not taking: Reported on 05/24/2017 10/09/15   Dowless, Lelon MastSamantha Tripp, PA-C  traMADol (ULTRAM) 50 MG tablet Take 1 tablet (50 mg total) by mouth every 6 (six) hours as needed. 05/24/17   Benjiman CorePickering, Nathan, MD  Family History Family History  Problem Relation Age of Onset  . Hypertension Father     Social History Social History   Tobacco Use  . Smoking status: Never Smoker  . Smokeless tobacco: Never Used  Substance Use Topics  . Alcohol use: Yes    Alcohol/week: 0.0 oz    Comment: 10/11/2015 "rarely I'll have wine"  . Drug use: No     Allergies   Penicillins and Omeprazole magnesium   Review of Systems Review of Systems  All other systems reviewed and are negative.    Physical Exam Updated Vital Signs BP (!) 122/93   Pulse 85   Temp 98.1 F (36.7 C) (Oral)    Resp 15   Ht 5\' 4"  (1.626 m)   Wt 74.8 kg (165 lb)   LMP 10/17/2017 (Exact Date)   SpO2 100%   BMI 28.32 kg/m   Physical Exam  Constitutional: She is oriented to person, place, and time. She appears well-developed and well-nourished.  HENT:  Head: Normocephalic and atraumatic.  Eyes: Conjunctivae are normal.  Neck: Neck supple.  Cardiovascular: Normal rate and regular rhythm.  No appreciable rub  Pulmonary/Chest: Effort normal and breath sounds normal.  Abdominal: Soft. Bowel sounds are normal.  Musculoskeletal: Normal range of motion.  Neurological: She is alert and oriented to person, place, and time.  Skin: Skin is warm and dry.  Psychiatric: She has a normal mood and affect. Her behavior is normal.  Nursing note and vitals reviewed.    ED Treatments / Results  Labs (all labs ordered are listed, but only abnormal results are displayed) Labs Reviewed  BASIC METABOLIC PANEL - Abnormal; Notable for the following components:      Result Value   Chloride 113 (*)    Calcium 7.9 (*)    All other components within normal limits  CBC - Abnormal; Notable for the following components:   WBC 3.9 (*)    RBC 3.32 (*)    Hemoglobin 8.9 (*)    HCT 28.3 (*)    All other components within normal limits  SEDIMENTATION RATE  I-STAT TROPONIN, ED  I-STAT BETA HCG BLOOD, ED ([facility], [facility], [facility] ONLY)    EKG EKG Interpretation  Date/Time:  Wednesday October 21 2017 12:55:50 EDT Ventricular Rate:  85 PR Interval:  142 QRS Duration: 68 QT Interval:  370 QTC Calculation: 440 R Axis:   16 Text Interpretation:  Normal sinus rhythm Low voltage QRS Cannot rule out Anterior infarct , age undetermined Abnormal ECG Confirmed by Donnetta Hutching ([id number]) on 10/21/2017 1:01:33 PM   Radiology Ct Chest Wo Contrast  Result Date: 10/21/2017 CLINICAL DATA:  Lung nodule, lupus, chronic cough. EXAM: CT CHEST WITHOUT CONTRAST TECHNIQUE: Multidetector CT imaging of the chest was performed following the standard  protocol without IV contrast. COMPARISON:  05/24/2017. FINDINGS: Cardiovascular: Moderate to large pericardial effusion is new. Heart size is within normal limits. Mediastinum/Nodes: Axillary lymph nodes measure up to 1.6 cm on the left, increased from 1.2 cm. No definite mediastinal adenopathy. Hilar regions are difficult to evaluate without IV contrast. Esophagus is grossly unremarkable. Lungs/Pleura: 6 mm left lower lobe nodule (series 8, image 73) is unchanged. Image quality is degraded by expiratory phase imaging. There are new scattered ill-defined nodular densities in the lungs, seen predominantly peripherally. Smooth septal thickening is seen in the inferior lower lobes. Trace bilateral pleural fluid. Airway is unremarkable. Upper Abdomen: Visualized portions of the liver, gallbladder, adrenal glands, kidneys, spleen, pancreas, stomach and bowel are  grossly unremarkable. Musculoskeletal: Negative. IMPRESSION: 1. Moderate to large pericardial effusion, new from 05/24/2017. These results will be called to the ordering clinician or representative by the Radiologist Assistant, and communication documented in the PACS or zVision Dashboard. 2. Suspect mild dependent pulmonary edema with trace bilateral pleural effusions. 3. 6 mm left lower lobe nodule is unchanged. Scattered additional new nodular densities bilaterally, possibly infectious or inflammatory in etiology. 4. Enlarging axillary lymph nodes, possibly reactive. A lymphoproliferative disorder cannot be excluded. Electronically Signed   By: Leanna Battles M.D.   On: 10/21/2017 12:12    Procedures Procedures (including critical care time)  Medications Ordered in ED Medications - No data to display   Initial Impression / Assessment and Plan / ED Course  I have reviewed the triage vital signs and the nursing notes.  Pertinent labs & imaging results that were available during my care of the patient were reviewed by me and considered in my medical  decision making (see chart for details).     Patient with known history of SLE presents with a significant pericardial effusion.  She is also anemic.  Will consult cardiology and general medicine.  Final Clinical Impressions(s) / ED Diagnoses   Final diagnoses:  Pericardial effusion  Systemic lupus erythematosus, unspecified SLE type, unspecified organ involvement status (HCC)  Anemia, unspecified type    ED Discharge Orders    None       Donnetta Hutching, MD 10/21/17 1507

## 2017-10-21 NOTE — ED Triage Notes (Signed)
Pt sent here by pcp for findings of fluid around the heart on CT scan. Pt has hx of lupus. VSS. Denies CP or shob.

## 2017-10-21 NOTE — Progress Notes (Signed)
  Echocardiogram 2D Echocardiogram has been performed.  Delcie RochENNINGTON, Kwasi Joung 10/21/2017, 4:01 PM

## 2017-10-21 NOTE — Consult Note (Addendum)
Cardiology Consultation:   Patient ID: Caroline Elliott; [id number]; 1991-01-29   Admit date: 10/21/2017 Date of Consult: 10/21/2017  Primary Care Provider: Kendrick Ranch, MD Primary Cardiologist: Dr. Eldridge Dace, new Primary Electrophysiologist:     Patient Profile:   Caroline Elliott is a 27 y.o. female with a hx of lupus and ascites (2017) who is being seen today for the evaluation of pericardial effusion at the request of Dr. Adriana Simas.  History of Present Illness:   Caroline Elliott presents with complaints of chest pain that is worse with lying flat. She had a CXR with her PCP that revealed lung nodule that was followed up with a CT chest. CT chest revealed a significant pericardial effusion and she was instructed to come to the ER. On arrival, she complains of chest pain when lying flat. This has been ongoing since Feb of this year. In Feb of 2019, she reported to the ER with chest pain that was worse when she was laying flat. CT angio showed no PE and no pericardial effusion at that time. It was thought her chest pain was related to a lupus flare, she was discharged on steroids. She followed up with her rheumatologist who agreed that her chest pain was MSK and related to a lupus flare. She continued to have ongoing chest pain. She presented to her PCP for constipation, who obtained an x-ray. Xray showed lung nodules that prompted a CT chest, which showed large pericardial effusion, prompting her ED visit.  On my interview, she reports ongoing chest pain that occurs about 3-4 times per month since last Feb, worse when she leans forward and worse when she is flat. The chest pain seems to correlate to right before menstruation. The chest pain is worse in certain positions, no relieving factors. The chest pain has not changed since Feb. She denies illness recently and prior to her Feb ER visit.   Stat echo showed moderate pericardial effusion, but no tamponade physiology. Sed rate is elevated to 85.    Of note, she had a bout of ascites, nausea and intractable vomiting in 2017 [facility]) for which a TTE was completed and showed trivial pericardial effusion.    Past Medical History:  Diagnosis Date  . Ascites 10/11/2015 hospitalized  . Lupus (HCC)   . Seasonal allergies     Past Surgical History:  Procedure Laterality Date  . ENTEROSCOPY N/A 10/16/2015   Procedure: ENTEROSCOPY;  Surgeon: Jeani Hawking, MD;  Location: [facility] END[facility]COPY;  Service: Endoscopy;  Laterality: N/A;  . NO PAST SURGERIES       Home Medications:  Prior to Admission medications   Medication Sig Start Date End Date Taking? Authorizing Provider  acetaminophen (TYLENOL) 325 MG tablet Take 2 tablets (650 mg total) by mouth every 6 (six) hours as needed for mild pain or moderate pain. Patient not taking: Reported on 05/24/2017 01/10/17   Everlene Farrier, PA-C  Diclofenac Sodium 3 % GEL Place 1 application onto the skin 2 (two) times daily. To affected area. 01/10/17   Everlene Farrier, PA-C  hydroxychloroquine (PLAQUENIL) 200 MG tablet Take 400 mg by mouth daily.    [provider]  methylPREDNISolone sodium succinate (SOLU-MEDROL) 40 mg/mL injection Inject 1 mL (40 mg total) into the vein every 12 (twelve) hours. Patient not taking: Reported on 05/24/2017 10/24/15   Catarina Hartshorn, MD  mycophenolate (MYFORTIC) 360 MG TBEC EC tablet Take 1 tablet (360 mg total) by mouth 2 (two) times daily. Patient taking differently: Take 1,440 mg  by mouth 2 (two) times daily.  10/24/15   Catarina Hartshorn, MD  naproxen (NAPROSYN) 500 MG tablet Take 500 mg by mouth daily as needed for mild pain.    [provider]  ondansetron (ZOFRAN ODT) 4 MG disintegrating tablet Take 1 tablet (4 mg total) by mouth every 8 (eight) hours as needed for nausea or vomiting. 10/09/15   Dowless, Lelon Mast Tripp, PA-C  predniSONE (DELTASONE) 20 MG tablet Take 3 tablets (60 mg total) by mouth daily. 05/24/17   Benjiman Core, MD  ranitidine (ZANTAC) 150 MG tablet  Take 1 tablet (150 mg total) by mouth 2 (two) times daily. Patient not taking: Reported on 05/24/2017 10/09/15   Dowless, Lelon Mast Tripp, PA-C  traMADol (ULTRAM) 50 MG tablet Take 1 tablet (50 mg total) by mouth every 6 (six) hours as needed. 05/24/17   Benjiman Core, MD    Inpatient Medications: Scheduled Meds:  Continuous Infusions:  PRN Meds:   Allergies:    Allergies  Allergen Reactions  . Penicillins Hives and Other (See Comments)    Has patient had a PCN reaction causing immediate rash, facial/tongue/throat swelling, SOB or lightheadedness with hypotension: No Has patient had a PCN reaction causing severe rash involving mucus membranes or skin necrosis: No Has patient had a PCN reaction that required hospitalization Yes Has patient had a PCN reaction occurring within the last 10 years: No If all of the above answers are "NO", then may proceed with Cephalosporin use.  Marland Kitchen Omeprazole Magnesium Rash    All over body. Pt. followed up with doctor post reaction.    Social History:   Social History   Socioeconomic History  . Marital status: Single    Spouse name: engaged-Dominique  . Number of children: 0  . Years of education: college  . Highest education level: Not on file  Occupational History  . Occupation: Radiation protection practitioner  Social Needs  . Financial resource strain: Not on file  . Food insecurity:    Worry: Not on file    Inability: Not on file  . Transportation needs:    Medical: Not on file    Non-medical: Not on file  Tobacco Use  . Smoking status: Never Smoker  . Smokeless tobacco: Never Used  Substance and Sexual Activity  . Alcohol use: Yes    Alcohol/week: 0.0 oz    Comment: 10/11/2015 "rarely I'll have wine"  . Drug use: No  . Sexual activity: Yes    Partners: Male    Birth control/protection: Coitus interruptus  Lifestyle  . Physical activity:    Days per week: Not on file    Minutes per session: Not on file  . Stress: Not on file   Relationships  . Social connections:    Talks on phone: Not on file    Gets together: Not on file    Attends religious service: Not on file    Active member of club or organization: Not on file    Attends meetings of clubs or organizations: Not on file    Relationship status: Not on file  . Intimate partner violence:    Fear of current or ex partner: Not on file    Emotionally abused: Not on file    Physically abused: Not on file    Forced sexual activity: Not on file  Other Topics Concern  . 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   Lives with her fiance.   Her family lives in [place].  Family History:    Family History  Problem Relation Age of Onset  . Hypertension Father      ROS:  Please see the history of present illness.   All other ROS reviewed and negative.     Physical Exam/Data:   Vitals:   10/21/17 1251 10/21/17 1252 10/21/17 1345  BP: (!) 131/96  (!) 122/93  Pulse: 78  85  Resp: 18  15  Temp: 98.1 F (36.7 C)    TempSrc: Oral    SpO2: 100%  100%  Weight:  165 lb (74.8 kg)   Height:  5\' 4"  (1.626 m)    No intake or output data in the 24 hours ending 10/21/17 1521 Filed Weights   10/21/17 1252  Weight: 165 lb (74.8 kg)   Body mass index is 28.32 kg/m.  General:  Well nourished, well developed, in no acute distress HEENT: normal Neck: no JVD Vascular: No carotid bruits  Cardiac:  normal S1, S2; RRR; S3 Lungs:  clear to auscultation bilaterally, no wheezing, rhonchi or rales  Abd: soft, nontender, no hepatomegaly  Ext: no edema Musculoskeletal:  No deformities, BUE and BLE strength normal and equal Skin: warm and dry  Neuro:  CNs 2-12 intact, no focal abnormalities noted Psych:  Normal affect   EKG:  The EKG was personally reviewed and demonstrates:  sinus Telemetry:  Telemetry was personally reviewed and demonstrates:  sinus  Relevant CV Studies:  Echo 10/21/17: Study Conclusions - Left  ventricle: The cavity size was normal. Systolic function was   normal. The estimated ejection fraction was in the range of 60%   to 65%. Wall motion was normal; there were no regional wall   motion abnormalities. Left ventricular diastolic function   parameters were normal. - Aortic valve: Transvalvular velocity was within the normal range.   There was no stenosis. There was no regurgitation. - Mitral valve: Transvalvular velocity was within the normal range.   There was no evidence for stenosis. There was trivial   regurgitation. - Right ventricle: The cavity size was normal. Wall thickness was   normal. Systolic function was normal. - Tricuspid valve: There was mild regurgitation. Peak RV-RA   gradient (S): 29 mm Hg. - Pulmonary arteries: Systolic pressure was within the normal   range. PA peak pressure: 32 mm Hg (S). - Pericardium, extracardiac: A moderate pericardial effusion was   identified circumferential to the heart measuring up to 1.7 cm.   There was no chamber collapse. There was evidence for increased   RV-LV interaction demonstrated by respirophasic changes in   tricuspid velocities. Features were not consistent with tamponade   physiology.   Laboratory Data:  Chemistry Recent Labs  Lab 10/21/17 1346  NA 140  K 3.7  CL 113*  CO2 22  GLUCOSE 81  BUN 19  CREATININE 0.75  CALCIUM 7.9*  GFRNONAA >60  GFRAA >60  ANIONGAP 5    No results for input(s): PROT, ALBUMIN, AST, ALT, ALKPHOS, BILITOT in the last 168 hours. Hematology Recent Labs  Lab 10/21/17 1346  WBC 3.9*  RBC 3.32*  HGB 8.9*  HCT 28.3*  MCV 85.2  MCH 26.8  MCHC 31.4  RDW 14.2  PLT 251   Cardiac EnzymesNo results for input(s): TROPONINI in the last 168 hours.  Recent Labs  Lab 10/21/17 1403  TROPIPOC 0.00    BNPNo results for input(s): BNP, PROBNP in the last 168 hours.  DDimer No results for input(s): DDIMER in the last 168 hours.  Radiology/Studies:  Ct Chest Wo Contrast  Result  Date: 10/21/2017 CLINICAL DATA:  Lung nodule, lupus, chronic cough. EXAM: CT CHEST WITHOUT CONTRAST TECHNIQUE: Multidetector CT imaging of the chest was performed following the standard protocol without IV contrast. COMPARISON:  05/24/2017. FINDINGS: Cardiovascular: Moderate to large pericardial effusion is new. Heart size is within normal limits. Mediastinum/Nodes: Axillary lymph nodes measure up to 1.6 cm on the left, increased from 1.2 cm. No definite mediastinal adenopathy. Hilar regions are difficult to evaluate without IV contrast. Esophagus is grossly unremarkable. Lungs/Pleura: 6 mm left lower lobe nodule (series 8, image 73) is unchanged. Image quality is degraded by expiratory phase imaging. There are new scattered ill-defined nodular densities in the lungs, seen predominantly peripherally. Smooth septal thickening is seen in the inferior lower lobes. Trace bilateral pleural fluid. Airway is unremarkable. Upper Abdomen: Visualized portions of the liver, gallbladder, adrenal glands, kidneys, spleen, pancreas, stomach and bowel are grossly unremarkable. Musculoskeletal: Negative. IMPRESSION: 1. Moderate to large pericardial effusion, new from 05/24/2017. These results will be called to the ordering clinician or representative by the Radiologist Assistant, and communication documented in the PACS or zVision Dashboard. 2. Suspect mild dependent pulmonary edema with trace bilateral pleural effusions. 3. 6 mm left lower lobe nodule is unchanged. Scattered additional new nodular densities bilaterally, possibly infectious or inflammatory in etiology. 4. Enlarging axillary lymph nodes, possibly reactive. A lymphoproliferative disorder cannot be excluded. Electronically Signed   By: Leanna BattlesMelinda  Blietz M.D.   On: 10/21/2017 12:12    Assessment and Plan:   1. Large pericardial effusion found on CT chest - on arrival, she is HDS - stat echo showed moderate pericardial effusion without evidence of tamponade  physiology - sed rate elevated at 85, was 27 in 2017 - will hold off on pericardiocentesis at this time and will monitor on telemetry - etiology for her effusion is unclear, may be related to her lupus - would appreciate rheumatology input, consult order placed in Epic   2. Hx of lupus nephritis, per note in Care Everywhere - will consult nephrology to see if OK for NSAIDs - sCr normal on arrival   3. Anemia  - Hb 8.9, baseline appears to be 11 - per primary team - effusion on echo not consistent with blood   4. Chest pain - EKG without signs of ischemia - chest pain worse when supine or leaning forward, but not consistently every night - occurs 3-4 times monthly and seems to coincide with menstruation - chest pain likely related to her effusion, although CT chest in Feb did not show effusion when she presented with the same chest pain   5. SLE - as above   For questions or updates, please contact [facility] Please consult [email or web address] for contact info under Cardiology/STEMI.   Signed, Caroline Elliott, [place]  10/21/2017 3:21 PM   I have examined the patient and reviewed assessment and plan and discussed with patient.  Agree with above as stated.  D/w Dr. Marisue HumbleSanford regarding therapy for presumed pericarditis as cause of effusion.  WIll try to avoid NSAIDs given lupus nephritis.  Will start with colchicine.  If effusion does not respond, would switch to a short term course of NSAIDs with close monitoring of renal function. Colchicine ordered.  Caroline Elliott

## 2017-10-21 NOTE — ED Notes (Signed)
Pt states that she has CP at night when lying flat, denies pain at this time.

## 2017-10-21 NOTE — ED Notes (Signed)
Notified pharmacy for need for medications.

## 2017-10-21 NOTE — H&P (Addendum)
History and Physical    KEYLEN UZELAC KZS:[id number] DOB: 1990-06-14 DOA: 10/21/2017  PCP: Lanice Shirts, MD  Rheum: Dr Trudie Reed Nephrologist: Joelyn Oms Consultants:  cardiology Patient coming from: Home - lives with mother  Chief Complaint: abnormal CT scan. Sent to ED  HPI: Caroline Elliott is a 27 y.o. female with medical history significant of SLE, chronic anemia with baseline Hgb ~10, admission 7/17 for enterocolitis and ascites presents from PCP after abnormal chest CT showed large pericardial effusion. CT ordered today to follow up on lung nodule found several months ago and large pericardial effusion found incidentally. Pt reports intermittent chest pain occurring when lying flat since February with intermittent cough. She denies any sob, no recent headache, dizziness, n/v/d. Constipation improved with recent laxative. Recent menses ended 5 days ago.  ED Course: Hgb 8.9, BMET unremarkable, ESR 85, upreg negative. Stat echo showed moderate pleural effusion, but no tamponade physiology.    Review of Systems: As per HPI; otherwise review of systems reviewed and negative.   Ambulatory Status: Ambulates without assistance  Past Medical History:  Diagnosis Date  . Ascites 10/11/2015 hospitalized  . Lupus ([place])   . Seasonal allergies     Past Surgical History:  Procedure Laterality Date  . ENTEROSCOPY N/A 10/16/2015   Procedure: ENTEROSCOPY;  Surgeon: Carol Ada, MD;  Location: [facility] ENDOSCOPY;  Service: Endoscopy;  Laterality: N/A;  . NO PAST SURGERIES      Social History   Socioeconomic History  . Marital status: Single    Spouse name: engaged-Dominique  . Number of children: 0  . Years of education: college  . Highest education level: Not on file  Occupational History  . Occupation: Architect  Social Needs  . Financial resource strain: Not on file  . Food insecurity:    Worry: Not on file    Inability: Not on file  . Transportation needs:   Medical: Not on file    Non-medical: Not on file  Tobacco Use  . Smoking status: Never Smoker  . Smokeless tobacco: Never Used  Substance and Sexual Activity  . Alcohol use: Yes    Alcohol/week: 0.0 oz    Comment: 10/11/2015 "rarely I'll have wine"  . Drug use: No  . Sexual activity: Yes    Partners: Male    Birth control/protection: Coitus interruptus  Lifestyle  . Physical activity:    Days per week: Not on file    Minutes per session: Not on file  . Stress: Not on file  Relationships  . Social connections:    Talks on phone: Not on file    Gets together: Not on file    Attends religious service: Not on file    Active member of club or organization: Not on file    Attends meetings of clubs or organizations: Not on file    Relationship status: Not on file  . Intimate partner violence:    Fear of current or ex partner: Not on file    Emotionally abused: Not on file    Physically abused: Not on file    Forced sexual activity: Not on file  Other Topics Concern  . 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   Lives with her fiance.   Her family lives in [place].    Allergies  Allergen Reactions  . Penicillins Hives and Other (See Comments)    Has patient had a PCN reaction causing immediate rash, facial/tongue/throat swelling, SOB or lightheadedness  with hypotension: No Has patient had a PCN reaction causing severe rash involving mucus membranes or skin necrosis: No Has patient had a PCN reaction that required hospitalization Yes Has patient had a PCN reaction occurring within the last 10 years: No If all of the above answers are "NO", then may proceed with Cephalosporin use.  Marland Kitchen Omeprazole Magnesium Rash    All over body. Pt. followed up with doctor post reaction.    Family History  Problem Relation Age of Onset  . Hypertension Father     Prior to Admission medications   Medication Sig Start Date End Date Taking? Authorizing  Provider  acetaminophen (TYLENOL) 325 MG tablet Take 2 tablets (650 mg total) by mouth every 6 (six) hours as needed for mild pain or moderate pain. Patient not taking: Reported on 05/24/2017 01/10/17   Waynetta Pean, PA-C  Diclofenac Sodium 3 % GEL Place 1 application onto the skin 2 (two) times daily. To affected area. 01/10/17   Waynetta Pean, PA-C  hydroxychloroquine (PLAQUENIL) 200 MG tablet Take 400 mg by mouth daily.    [provider]  methylPREDNISolone sodium succinate (SOLU-MEDROL) 40 mg/mL injection Inject 1 mL (40 mg total) into the vein every 12 (twelve) hours. Patient not taking: Reported on 05/24/2017 10/24/15   Orson Eva, MD  mycophenolate (MYFORTIC) 360 MG TBEC EC tablet Take 1 tablet (360 mg total) by mouth 2 (two) times daily. Patient taking differently: Take 1,440 mg by mouth 2 (two) times daily.  10/24/15   Orson Eva, MD  naproxen (NAPROSYN) 500 MG tablet Take 500 mg by mouth daily as needed for mild pain.    [provider]  ondansetron (ZOFRAN ODT) 4 MG disintegrating tablet Take 1 tablet (4 mg total) by mouth every 8 (eight) hours as needed for nausea or vomiting. 10/09/15   Dowless, Aldona Bar Tripp, PA-C  predniSONE (DELTASONE) 20 MG tablet Take 3 tablets (60 mg total) by mouth daily. 05/24/17   Davonna Belling, MD  ranitidine (ZANTAC) 150 MG tablet Take 1 tablet (150 mg total) by mouth 2 (two) times daily. Patient not taking: Reported on 05/24/2017 10/09/15   Dowless, Aldona Bar Tripp, PA-C  traMADol (ULTRAM) 50 MG tablet Take 1 tablet (50 mg total) by mouth every 6 (six) hours as needed. 05/24/17   Davonna Belling, MD    Physical Exam: Vitals:   10/21/17 1251 10/21/17 1252 10/21/17 1345  BP: (!) 131/96  (!) 122/93  Pulse: 78  85  Resp: 18  15  Temp: 98.1 F (36.7 C)    TempSrc: Oral    SpO2: 100%  100%  Weight:  74.8 kg (165 lb)   Height:  '5\' 4"'$  (1.626 m)      General: Appears calm and comfortable and is NAD Eyes: EOMI, normal lids, iris ENT:  grossly normal hearing, lips & tongue, mmm; appropriate dentition Neck: no LAD, masses or thyromegaly; no carotid bruits Cardiovascular: S1S2 RRR, no m/r/g. No LE edema.  Respiratory:  CTA bilaterally with no wheezes/rales/rhonchi.  Normal respiratory effort. Abdomen: soft, NT, ND, NABS Skin: no rash or induration seen on limited exam Musculoskeletal: grossly normal tone BUE/BLE, good ROM, no bony abnormality Lower extremity: No LE edema.  Limited foot exam with no ulcerations.  2+ distal pulses. Psychiatric: grossly normal mood and affect, speech fluent and appropriate, AOx3 Neurologic: CN 2-12 grossly intact, moves all extremities in coordinated fashion, sensation intact   Radiological Exams on Admission: Ct Chest Wo Contrast  Result Date: 10/21/2017 CLINICAL DATA:  Lung  nodule, lupus, chronic cough. EXAM: CT CHEST WITHOUT CONTRAST TECHNIQUE: Multidetector CT imaging of the chest was performed following the standard protocol without IV contrast. COMPARISON:  05/24/2017. FINDINGS: Cardiovascular: Moderate to large pericardial effusion is new. Heart size is within normal limits. Mediastinum/Nodes: Axillary lymph nodes measure up to 1.6 cm on the left, increased from 1.2 cm. No definite mediastinal adenopathy. Hilar regions are difficult to evaluate without IV contrast. Esophagus is grossly unremarkable. Lungs/Pleura: 6 mm left lower lobe nodule (series 8, image 73) is unchanged. Image quality is degraded by expiratory phase imaging. There are new scattered ill-defined nodular densities in the lungs, seen predominantly peripherally. Smooth septal thickening is seen in the inferior lower lobes. Trace bilateral pleural fluid. Airway is unremarkable. Upper Abdomen: Visualized portions of the liver, gallbladder, adrenal glands, kidneys, spleen, pancreas, stomach and bowel are grossly unremarkable. Musculoskeletal: Negative. IMPRESSION: 1. Moderate to large pericardial effusion, new from 05/24/2017. These  results will be called to the ordering clinician or representative by the Radiologist Assistant, and communication documented in the PACS or zVision Dashboard. 2. Suspect mild dependent pulmonary edema with trace bilateral pleural effusions. 3. 6 mm left lower lobe nodule is unchanged. Scattered additional new nodular densities bilaterally, possibly infectious or inflammatory in etiology. 4. Enlarging axillary lymph nodes, possibly reactive. A lymphoproliferative disorder cannot be excluded. Electronically Signed   By: Lorin Picket M.D.   On: 10/21/2017 12:12    EKG: Independently reviewed.  NSR with rate 85; nonspecific ST changes with no evidence of acute ischemia   Labs on Admission: I have personally reviewed the available labs and imaging studies at the time of the admission.  Pertinent labs:  Hgb 8.9 (from 10.7 05/2017) ESR 85 BMET wnl upreg negative    Assessment/Plan Principal Problem:   Pericardial effusion Active Problems:   Constipation   SLE (systemic lupus erythematosus) (HCC)   Anemia    Large pericardial effusion -seen on CT chest. Echo done in ED showed moderate effusion without tamponade physiology -Pt is HD stable. No pericardiocentesis planned at this time -consider steroids vs colchicine. Could reach out to rheumatologist during office hours tomorrow to get recs -admit to telemetry -cardiology to follow  Anemia -chronic dz likely exacerbated by recent heavy menses. -monitor trend.  -Hgb on admit 8.9 (baseline 10-11)  SLE -see above -continue home meds  Constipation -improved with recent laxative -will order stool softener as ongoing issue for pt. -likely cause of above issue   DVT prophylaxis: early ambulation Code Status: Full - confirmed with patient/family Family Communication: mother and grandmother at bedside on admission Disposition Plan: Home once clinically improved Consults called: cardiology  Admission status: obs   Alan Ripper, NP-C [place]  pgr [phone]  If note is complete, please contact covering daytime or nighttime physician. [email or web address] Password [facility]  10/21/2017, 4:26 PM

## 2017-10-22 DIAGNOSIS — M3212 Pericarditis in systemic lupus erythematosus: Secondary | ICD-10-CM | POA: Diagnosis not present

## 2017-10-22 DIAGNOSIS — I313 Pericardial effusion (noninflammatory): Secondary | ICD-10-CM | POA: Diagnosis not present

## 2017-10-22 DIAGNOSIS — D649 Anemia, unspecified: Secondary | ICD-10-CM

## 2017-10-22 LAB — CBC
HEMATOCRIT: 25.5 % — AB (ref 36.0–46.0)
Hemoglobin: 8.2 g/dL — ABNORMAL LOW (ref 12.0–15.0)
MCH: 27.7 pg (ref 26.0–34.0)
MCHC: 32.2 g/dL (ref 30.0–36.0)
MCV: 86.1 fL (ref 78.0–100.0)
Platelets: 243 10*3/uL (ref 150–400)
RBC: 2.96 MIL/uL — ABNORMAL LOW (ref 3.87–5.11)
RDW: 14.2 % (ref 11.5–15.5)
WBC: 3.5 10*3/uL — AB (ref 4.0–10.5)

## 2017-10-22 LAB — HIV ANTIBODY (ROUTINE TESTING W REFLEX): HIV Screen 4th Generation wRfx: NONREACTIVE

## 2017-10-22 MED ORDER — COLCHICINE 0.6 MG PO TABS: 0.6000 mg | ORAL_TABLET | Freq: Two times a day (BID) | ORAL | 0 refills | Status: AC

## 2017-10-22 NOTE — Discharge Summary (Signed)
Physician Discharge Summary  DEAUNDRA Elliott XBM:[id number] DOB: [birth year] DOA: 10/21/2017  PCP: Kendrick Ranch, MD  Admit date: 10/21/2017 Discharge date: 10/22/2017  Admitted From: Home  Disposition:  Home   Recommendations for Outpatient Follow-up:  1. Follow up with Cardiology in 1 week for limited echo 2. Follow up with Rheumatology in 2-3 weeks 3. Please obtain CBC in one week   Home Health: None  Equipment/Devices: None  Discharge Condition: Good  CODE STATUS: FULL Diet recommendation: Regular  Brief/Interim Summary: Caroline Elliott is a 27 y.o. F with SLE, chronic anemia, hx lupus nephritis class 3, normal renal function now, on Mycophenolate, plaquenil, prednisone who presents with abnormal CXR, found to have pericardial effusion.  Evidently, patient had a CXR some time ago that showed a lung nodule.  She was having a follow up CT chest and was noted to have a pericardial effusion and was sent to the ER.  Of note, she had had some intermittent chest pains for some time, partly positional.        Discharge Diagnoses:   Pericardial effusion From Lupus.  Echo done here shows no tamponade physiology, patient is asymptomatic today, and hemodynamics are stable.  Started on colchicine yesterday.  Discussed with Dr. Lennette Bihari and Dr. Eldridge Dace.   Will continue BID colchicine after discussion with Dr. Lennette Bihari.  She will arrange close Rheum follow up.  Evidently, Rheumatology had already been considering starting Benlysta.      Anemia Baseline 10-11, slightly worsened this admission.  Follow up with Dr. Lennette Bihari   SLE No change to regimen.  Follow up with Rheumatology arranged before discharge.      Discharge Instructions  Discharge Instructions    Diet general   Complete by:  As directed    Discharge instructions   Complete by:  As directed    From Dr. Maryfrances Elliott: You were seen for an abnormal chest x-ray and found by accident to have a pericardial effusion. This  pericardial effusion is very likely to be from your Lupus.  For the effusion:  Take colchicine 0.6 mg twice daily Follow up with Dr. Lendon Colonel as instructed.  I have sent a referral to Cardiology ([facility]) and they will contact you for an appointment in the next 3-4 weeks.   Increase activity slowly   Complete by:  As directed      Allergies as of 10/22/2017      Reactions   Penicillins Hives, Other (See Comments)   Has patient had a PCN reaction causing immediate rash, facial/tongue/throat swelling, SOB or lightheadedness with hypotension: No Has patient had a PCN reaction causing severe rash involving mucus membranes or skin necrosis: No Has patient had a PCN reaction that required hospitalization Yes Has patient had a PCN reaction occurring within the last 10 years: No If all of the above answers are "NO", then may proceed with Cephalosporin use.   Nickel Rash   Omeprazole Magnesium Rash   All over body. Pt. followed up with doctor post reaction.      Medication List    STOP taking these medications   methylPREDNISolone sodium succinate 40 mg/mL injection Commonly known as:  SOLU-MEDROL   naproxen 500 MG tablet Commonly known as:  NAPROSYN   naproxen sodium 550 MG tablet Commonly known as:  ANAPROX     TAKE these medications   acetaminophen 325 MG tablet Commonly known as:  TYLENOL Take 2 tablets (650 mg total) by mouth every 6 (six) hours as needed for mild  pain or moderate pain.   colchicine 0.6 MG tablet Take 1 tablet (0.6 mg total) by mouth 2 (two) times daily.   Diclofenac Sodium 3 % Gel Place 1 application onto the skin 2 (two) times daily. To affected area.   hydroxychloroquine 200 MG tablet Commonly known as:  PLAQUENIL Take 400 mg by mouth daily.   mycophenolate 360 MG Tbec EC tablet Commonly known as:  MYFORTIC Take 1 tablet (360 mg total) by mouth 2 (two) times daily. What changed:  how much to take   ondansetron 4 MG disintegrating  tablet Commonly known as:  ZOFRAN ODT Take 1 tablet (4 mg total) by mouth every 8 (eight) hours as needed for nausea or vomiting.   predniSONE 5 MG tablet Commonly known as:  DELTASONE Take 5 mg by mouth daily. What changed:  Another medication with the same name was removed. Continue taking this medication, and follow the directions you see here.   ranitidine 150 MG tablet Commonly known as:  ZANTAC Take 1 tablet (150 mg total) by mouth 2 (two) times daily.   traMADol 50 MG tablet Commonly known as:  ULTRAM Take 1 tablet (50 mg total) by mouth every 6 (six) hours as needed.   triamcinolone cream 0.1 % Commonly known as:  KENALOG Apply 1 application topically as needed (skin irritation).       Allergies  Allergen Reactions  . Penicillins Hives and Other (See Comments)    Has patient had a PCN reaction causing immediate rash, facial/tongue/throat swelling, SOB or lightheadedness with hypotension: No Has patient had a PCN reaction causing severe rash involving mucus membranes or skin necrosis: No Has patient had a PCN reaction that required hospitalization Yes Has patient had a PCN reaction occurring within the last 10 years: No If all of the above answers are "NO", then may proceed with Cephalosporin use.  . Nickel Rash  . Omeprazole Magnesium Rash    All over body. Pt. followed up with doctor post reaction.    Consultations:  Cardiology   Procedures/Studies: Ct Chest Wo Contrast  Result Date: 10/21/2017 CLINICAL DATA:  Lung nodule, lupus, chronic cough. EXAM: CT CHEST WITHOUT CONTRAST TECHNIQUE: Multidetector CT imaging of the chest was performed following the standard protocol without IV contrast. COMPARISON:  05/24/2017. FINDINGS: Cardiovascular: Moderate to large pericardial effusion is new. Heart size is within normal limits. Mediastinum/Nodes: Axillary lymph nodes measure up to 1.6 cm on the left, increased from 1.2 cm. No definite mediastinal adenopathy. Hilar  regions are difficult to evaluate without IV contrast. Esophagus is grossly unremarkable. Lungs/Pleura: 6 mm left lower lobe nodule (series 8, image 73) is unchanged. Image quality is degraded by expiratory phase imaging. There are new scattered ill-defined nodular densities in the lungs, seen predominantly peripherally. Smooth septal thickening is seen in the inferior lower lobes. Trace bilateral pleural fluid. Airway is unremarkable. Upper Abdomen: Visualized portions of the liver, gallbladder, adrenal glands, kidneys, spleen, pancreas, stomach and bowel are grossly unremarkable. Musculoskeletal: Negative. IMPRESSION: 1. Moderate to large pericardial effusion, new from 05/24/2017. These results will be called to the ordering clinician or representative by the Radiologist Assistant, and communication documented in the PACS or zVision Dashboard. 2. Suspect mild dependent pulmonary edema with trace bilateral pleural effusions. 3. 6 mm left lower lobe nodule is unchanged. Scattered additional new nodular densities bilaterally, possibly infectious or inflammatory in etiology. 4. Enlarging axillary lymph nodes, possibly reactive. A lymphoproliferative disorder cannot be excluded. Electronically Signed   By: Leanna BattlesMelinda  Blietz M.D.  On: 10/21/2017 12:12   Dg Abd 2 Views  Result Date: 10/07/2017 CLINICAL DATA:  Abdominal pain and constipation EXAM: ABDOMEN - 2 VIEW COMPARISON:  December 24, 2015 FINDINGS: There is diffuse stool throughout colon. There is no appreciable bowel dilatation or air-fluid level to suggest bowel obstruction. No free air. Lung bases are clear. There are a few tiny phleboliths in the pelvis. IMPRESSION: Diffuse stool throughout colon consistent with constipation. No bowel obstruction or free air evident. Electronically Signed   By: Bretta Bang III M.D.   On: 10/07/2017 15:45       Subjective: Chest pain is maybe worse with laying on back.  No dyspnea, dizziness, palpitations,  lightheadedness, tachycardia.  No cough.  Discharge Exam: Vitals:   10/21/17 2336 10/22/17 0745  BP: 112/72 (!) 121/93  Pulse: 80 77  Resp: 17   Temp: 98 F (36.7 C) 98.1 F (36.7 C)  SpO2: 99% 99%   Vitals:   10/21/17 1715 10/21/17 1810 10/21/17 2336 10/22/17 0745  BP: (!) 128/93 126/85 112/72 (!) 121/93  Pulse: 87 85 80 77  Resp: 20 18 17    Temp:  97.8 F (36.6 C) 98 F (36.7 C) 98.1 F (36.7 C)  TempSrc:  Oral Oral Oral  SpO2: 100% 100% 99% 99%  Weight:  76.2 kg (167 lb 15.9 oz)    Height:  5\' 4"  (1.626 m)      General: Pt is alert, awake, not in acute distress Cardiovascular: RRR, S1/S2 +, no rubs, no gallops Respiratory: CTA bilaterally, no wheezing, no rhonchi Abdominal: Soft, NT, ND, bowel sounds + Extremities: no edema, no cyanosis    The results of significant diagnostics from this hospitalization (including imaging, microbiology, ancillary and laboratory) are listed below for reference.     Microbiology: No results found for this or any previous visit (from the past 240 hour(s)).   Labs: BNP (last 3 results) No results for input(s): BNP in the last 8760 hours. Basic Metabolic Panel: Recent Labs  Lab 10/21/17 1346  NA 140  K 3.7  CL 113*  CO2 22  GLUCOSE 81  BUN 19  CREATININE 0.75  CALCIUM 7.9*   Liver Function Tests: No results for input(s): AST, ALT, ALKPHOS, BILITOT, PROT, ALBUMIN in the last 168 hours. No results for input(s): LIPASE, AMYLASE in the last 168 hours. No results for input(s): AMMONIA in the last 168 hours. CBC: Recent Labs  Lab 10/21/17 1346 10/22/17 0410  WBC 3.9* 3.5*  HGB 8.9* 8.2*  HCT 28.3* 25.5*  MCV 85.2 86.1  PLT 251 243   Cardiac Enzymes: No results for input(s): CKTOTAL, CKMB, CKMBINDEX, TROPONINI in the last 168 hours. BNP: Invalid input(s): POCBNP CBG: No results for input(s): GLUCAP in the last 168 hours. D-Dimer No results for input(s): DDIMER in the last 72 hours. Hgb A1c No results for  input(s): HGBA1C in the last 72 hours. Lipid Profile No results for input(s): CHOL, HDL, LDLCALC, TRIG, CHOLHDL, LDLDIRECT in the last 72 hours. Thyroid function studies No results for input(s): TSH, T4TOTAL, T3FREE, THYROIDAB in the last 72 hours.  Invalid input(s): FREET3 Anemia work up No results for input(s): VITAMINB12, FOLATE, FERRITIN, TIBC, IRON, RETICCTPCT in the last 72 hours. Urinalysis    Component Value Date/Time   COLORURINE AMBER (A) 10/15/2015 1446   APPEARANCEUR TURBID (A) 10/15/2015 1446   LABSPEC 1.030 10/15/2015 1446   PHURINE 5.0 10/15/2015 1446   GLUCOSEU NEGATIVE 10/15/2015 1446   HGBUR LARGE (A) 10/15/2015 1446   BILIRUBINUR  SMALL (A) 10/15/2015 1446   KETONESUR NEGATIVE 10/15/2015 1446   PROTEINUR 100 (A) 10/15/2015 1446   UROBILINOGEN 0.2 07/22/2007 0504   NITRITE NEGATIVE 10/15/2015 1446   LEUKOCYTESUR MODERATE (A) 10/15/2015 1446   Sepsis Labs Invalid input(s): PROCALCITONIN,  WBC,  LACTICIDVEN Microbiology No results found for this or any previous visit (from the past 240 hour(s)).   Time coordinating discharge: 35 minutes       SIGNED:   Alberteen Sam, MD  [facility] 10/22/2017, 4:27 PM

## 2017-10-22 NOTE — Progress Notes (Signed)
Progress Note  Patient Name: Caroline Elliott Date of Encounter: 10/22/2017  Primary Cardiologist: Lance Muss, MD   Subjective   Feels well.  No shortness of breath.  Inpatient Medications    Scheduled Meds: . colchicine  0.6 mg Oral BID  . docusate sodium  100 mg Oral BID  . hydroxychloroquine  400 mg Oral Daily  . mycophenolate  720 mg Oral BID  . predniSONE  5 mg Oral Q breakfast  . sodium chloride flush  3 mL Intravenous Q12H   Continuous Infusions: . sodium chloride     PRN Meds: sodium chloride, acetaminophen **OR** acetaminophen, magnesium citrate, naproxen, ondansetron **OR** ondansetron (ZOFRAN) IV, sodium chloride flush, traMADol   Vital Signs    Vitals:   10/21/17 1715 10/21/17 1810 10/21/17 2336 10/22/17 0745  BP: (!) 128/93 126/85 112/72 (!) 121/93  Pulse: 87 85 80 77  Resp: 20 18 17    Temp:  97.8 F (36.6 C) 98 F (36.7 C) 98.1 F (36.7 C)  TempSrc:  Oral Oral Oral  SpO2: 100% 100% 99% 99%  Weight:  167 lb 15.9 oz (76.2 kg)    Height:  5\' 4"  (1.626 m)      Intake/Output Summary (Last 24 hours) at 10/22/2017 1206 Last data filed at 10/22/2017 1010 Gross per 24 hour  Intake 3 ml  Output -  Net 3 ml   Filed Weights   10/21/17 1252 10/21/17 1810  Weight: 165 lb (74.8 kg) 167 lb 15.9 oz (76.2 kg)    Telemetry    NSR - Personally Reviewed  ECG    NSR, low voltage - Personally Reviewed  Physical Exam   GEN: No acute distress.   Neck: No JVD Cardiac: RRR, no murmurs, rubs, or gallops.  Respiratory: Clear to auscultation bilaterally. GI: Soft, nontender, non-distended  MS: No edema; No deformity. Neuro:  Nonfocal  Psych: Normal affect   Labs    Chemistry Recent Labs  Lab 10/21/17 1346  NA 140  K 3.7  CL 113*  CO2 22  GLUCOSE 81  BUN 19  CREATININE 0.75  CALCIUM 7.9*  GFRNONAA >60  GFRAA >60  ANIONGAP 5     Hematology Recent Labs  Lab 10/21/17 1346 10/22/17 0410  WBC 3.9* 3.5*  RBC 3.32* 2.96*  HGB 8.9*  8.2*  HCT 28.3* 25.5*  MCV 85.2 86.1  MCH 26.8 27.7  MCHC 31.4 32.2  RDW 14.2 14.2  PLT 251 243    Cardiac EnzymesNo results for input(s): TROPONINI in the last 168 hours.  Recent Labs  Lab 10/21/17 1403  TROPIPOC 0.00     BNPNo results for input(s): BNP, PROBNP in the last 168 hours.   DDimer No results for input(s): DDIMER in the last 168 hours.   Radiology    Ct Chest Wo Contrast  Result Date: 10/21/2017 CLINICAL DATA:  Lung nodule, lupus, chronic cough. EXAM: CT CHEST WITHOUT CONTRAST TECHNIQUE: Multidetector CT imaging of the chest was performed following the standard protocol without IV contrast. COMPARISON:  05/24/2017. FINDINGS: Cardiovascular: Moderate to large pericardial effusion is new. Heart size is within normal limits. Mediastinum/Nodes: Axillary lymph nodes measure up to 1.6 cm on the left, increased from 1.2 cm. No definite mediastinal adenopathy. Hilar regions are difficult to evaluate without IV contrast. Esophagus is grossly unremarkable. Lungs/Pleura: 6 mm left lower lobe nodule (series 8, image 73) is unchanged. Image quality is degraded by expiratory phase imaging. There are new scattered ill-defined nodular densities in the lungs, seen predominantly  peripherally. Smooth septal thickening is seen in the inferior lower lobes. Trace bilateral pleural fluid. Airway is unremarkable. Upper Abdomen: Visualized portions of the liver, gallbladder, adrenal glands, kidneys, spleen, pancreas, stomach and bowel are grossly unremarkable. Musculoskeletal: Negative. IMPRESSION: 1. Moderate to large pericardial effusion, new from 05/24/2017. These results will be called to the ordering clinician or representative by the Radiologist Assistant, and communication documented in the PACS or zVision Dashboard. 2. Suspect mild dependent pulmonary edema with trace bilateral pleural effusions. 3. 6 mm left lower lobe nodule is unchanged. Scattered additional new nodular densities bilaterally,  possibly infectious or inflammatory in etiology. 4. Enlarging axillary lymph nodes, possibly reactive. A lymphoproliferative disorder cannot be excluded. Electronically Signed   By: Leanna BattlesMelinda  Blietz M.D.   On: 10/21/2017 12:12    Cardiac Studies     Patient Profile     27 y.o. female with pericardial effusion  Assessment & Plan    1) Effusion: likely related to lupus.  Treating with colchicine 0.6 mg daily.  Will plan for limited echo in a week to see if effusion has changed.  She should contact us if she has CP or shortness of breath, lightheadedness, palpitations or syncope. D/w Dr. Maryfrances Bunnellanford. [facility] will sign off.   Medication Recommendations:  Colchicine Other recommendations (labs, testing, etc):   Follow up as an outpatient:  Limited echo in one week.  For questions or updates, please contact [facility] Please consult [email or web address] for contact info under Cardiology/STEMI.      Signed, Lance MussJayadeep Deidre Carino, MD  10/22/2017, 12:06 PM

## 2017-10-22 NOTE — Progress Notes (Signed)
PROGRESS NOTE    Caroline Elliott  QQP:[id number] DOB: January 05, 1991 DOA: 10/21/2017 PCP: Lanice Shirts, MD  Outpatient Specialists:     Brief Narrative:  27 yo female with a PMH significant for SLE, chronic anemia (baseilne Hgb 10) is here at [facility] after presenting to PCP with abnormal chest CT showing a large pericardial effusion. Her chest pain has been intermittent since February, stating it is worse while lying flat. The patient was admitted to [facility] for treatment of pericardial effusion as well as monitoring of a pulmonary nodule discovered a few months ago. 2D echo shows normal ejection fraction 60-65% with moderate pericardial effusion circumferential to the heart measuring approximately 1.7 cm, no chamber collapse. EKG shows NSR with low voltage QRS. ESR 85.   Assessment & Plan:   Principal Problem:   Pericardial effusion Active Problems:   Constipation   SLE (systemic lupus erythematosus) (HCC)   Anemia  Pericardial Effusion: -Echo showed moderate-sized pericardial effusion, no evidence of tamponade. -ESR elevated at 85.  -Monitoring on telemetry -Continue Colchicine 0.6 mg BID, Prednisone 5 mg Q breakfast, and Naproxen 550 mg Q12H PRN. -WBC levels low at 3.5. Question if effusion is secondary to lupus  Anemia: -chronic issue, aggravated by patient's menses -Hbg today 8.2, monitor trends  SLE with Hx of Lupus Nephritis: -continue hydroxychloroquine 400 mg daily -ROS negative for fever, arthralgias, SOB, and CP -Rheumatology consult pending  Chest Pain: -Troponin 0.00 -EKG shows NSR with low voltage QRS -patient denies CP in the last 2 weeks  Constipation: -patient has not had a bowel movement since admission. Endorses obstipation.  -this is a chronic issue that has been going on for the last month -continue magnesium citrate prn and colace 100 mg BID  Lung Nodule: -CT chest wo contrast shows 6 mm left lower lobe nodule unchanged since February 2017 -patient  should continue to follow up with PCP to monitor growth  DVT prophylaxis: None at this time Code Status: Full Family Communication: Fiance and Mother present at bedside during interview. Endorses understanding of current plan Disposition Plan: Pending Rheumatology consult, to be discharged home with NSAIDs, corticosteroids, and colchicine for management of pericardial effusion.   Consultants:   Cardiology  Rheumatology  Procedures:   None  Antimicrobials:   None   Subjective: Patient was sitting upright in hospital bed alert and oriented in no acute distress. Denies SOB, CP, N/V/D, fever, myalgias. States she last had CP two weeks ago. When she does have pain, it is sharp in nature "like my heart is being squeezed" and is worse when lying supine. Endorses constipation, has not had a bowel movement since admission to hospital.   Objective: Vitals:   10/21/17 1715 10/21/17 1810 10/21/17 2336 10/22/17 0745  BP: (!) 128/93 126/85 112/72 (!) 121/93  Pulse: 87 85 80 77  Resp: '20 18 17   '$ Temp:  97.8 F (36.6 C) 98 F (36.7 C) 98.1 F (36.7 C)  TempSrc:  Oral Oral Oral  SpO2: 100% 100% 99% 99%  Weight:  76.2 kg (167 lb 15.9 oz)    Height:  '5\' 4"'$  (1.626 m)     No intake or output data in the 24 hours ending 10/22/17 0814 Filed Weights   10/21/17 1252 10/21/17 1810  Weight: 74.8 kg (165 lb) 76.2 kg (167 lb 15.9 oz)    Examination:  General exam: Appears calm and comfortable  Respiratory system: Clear to auscultation. Respiratory effort normal. Cardiovascular system: S1 & S2 heard but mildly muffled,  RRR. No JVD, murmurs, rubs, gallops or clicks. No pedal edema. Gastrointestinal system: Abdomen is nondistended, soft and nontender. No organomegaly or masses felt. Normal bowel sounds heard. Central nervous system: Alert and oriented. No focal neurological deficits. Extremities: Symmetric 5 x 5 power. Skin: No rashes, lesions or ulcers Psychiatry: Judgement and insight appear  normal. Mood & affect appropriate.     Data Reviewed: I have personally reviewed following labs and imaging studies  CBC: Recent Labs  Lab 10/21/17 1346 10/22/17 0410  WBC 3.9* 3.5*  HGB 8.9* 8.2*  HCT 28.3* 25.5*  MCV 85.2 86.1  PLT 251 762   Basic Metabolic Panel: Recent Labs  Lab 10/21/17 1346  NA 140  K 3.7  CL 113*  CO2 22  GLUCOSE 81  BUN 19  CREATININE 0.75  CALCIUM 7.9*   GFR: Estimated Creatinine Clearance: 107.4 mL/min (by C-G formula based on SCr of 0.75 mg/dL). Liver Function Tests: No results for input(s): AST, ALT, ALKPHOS, BILITOT, PROT, ALBUMIN in the last 168 hours. No results for input(s): LIPASE, AMYLASE in the last 168 hours. No results for input(s): AMMONIA in the last 168 hours. Coagulation Profile: No results for input(s): INR, PROTIME in the last 168 hours. Cardiac Enzymes: No results for input(s): CKTOTAL, CKMB, CKMBINDEX, TROPONINI in the last 168 hours. BNP (last 3 results) No results for input(s): PROBNP in the last 8760 hours. HbA1C: No results for input(s): HGBA1C in the last 72 hours. CBG: No results for input(s): GLUCAP in the last 168 hours. Lipid Profile: No results for input(s): CHOL, HDL, LDLCALC, TRIG, CHOLHDL, LDLDIRECT in the last 72 hours. Thyroid Function Tests: No results for input(s): TSH, T4TOTAL, FREET4, T3FREE, THYROIDAB in the last 72 hours. Anemia Panel: No results for input(s): VITAMINB12, FOLATE, FERRITIN, TIBC, IRON, RETICCTPCT in the last 72 hours. Urine analysis:    Component Value Date/Time   COLORURINE AMBER (A) 10/15/2015 1446   APPEARANCEUR TURBID (A) 10/15/2015 1446   LABSPEC 1.030 10/15/2015 1446   PHURINE 5.0 10/15/2015 1446   GLUCOSEU NEGATIVE 10/15/2015 1446   HGBUR LARGE (A) 10/15/2015 1446   BILIRUBINUR SMALL (A) 10/15/2015 1446   KETONESUR NEGATIVE 10/15/2015 1446   PROTEINUR 100 (A) 10/15/2015 1446   UROBILINOGEN 0.2 07/22/2007 0504   NITRITE NEGATIVE 10/15/2015 1446   LEUKOCYTESUR  MODERATE (A) 10/15/2015 1446   Sepsis Labs: '@LABRCNTIP'$ (procalcitonin:4,lacticidven:4)  )No results found for this or any previous visit (from the past 240 hour(s)).       Radiology Studies: Ct Chest Wo Contrast  Result Date: 10/21/2017 CLINICAL DATA:  Lung nodule, lupus, chronic cough. EXAM: CT CHEST WITHOUT CONTRAST TECHNIQUE: Multidetector CT imaging of the chest was performed following the standard protocol without IV contrast. COMPARISON:  05/24/2017. FINDINGS: Cardiovascular: Moderate to large pericardial effusion is new. Heart size is within normal limits. Mediastinum/Nodes: Axillary lymph nodes measure up to 1.6 cm on the left, increased from 1.2 cm. No definite mediastinal adenopathy. Hilar regions are difficult to evaluate without IV contrast. Esophagus is grossly unremarkable. Lungs/Pleura: 6 mm left lower lobe nodule (series 8, image 73) is unchanged. Image quality is degraded by expiratory phase imaging. There are new scattered ill-defined nodular densities in the lungs, seen predominantly peripherally. Smooth septal thickening is seen in the inferior lower lobes. Trace bilateral pleural fluid. Airway is unremarkable. Upper Abdomen: Visualized portions of the liver, gallbladder, adrenal glands, kidneys, spleen, pancreas, stomach and bowel are grossly unremarkable. Musculoskeletal: Negative. IMPRESSION: 1. Moderate to large pericardial effusion, new from 05/24/2017. These results will  be called to the ordering clinician or representative by the Radiologist Assistant, and communication documented in the PACS or zVision Dashboard. 2. Suspect mild dependent pulmonary edema with trace bilateral pleural effusions. 3. 6 mm left lower lobe nodule is unchanged. Scattered additional new nodular densities bilaterally, possibly infectious or inflammatory in etiology. 4. Enlarging axillary lymph nodes, possibly reactive. A lymphoproliferative disorder cannot be excluded. Electronically Signed   By:  Lorin Picket M.D.   On: 10/21/2017 12:12        Scheduled Meds: . colchicine  0.6 mg Oral BID  . docusate sodium  100 mg Oral BID  . hydroxychloroquine  400 mg Oral Daily  . mycophenolate  720 mg Oral BID  . predniSONE  5 mg Oral Q breakfast  . sodium chloride flush  3 mL Intravenous Q12H   Continuous Infusions: . sodium chloride       LOS: 0 days    Marney Setting, PA-S Myrene Buddy MD [facility] Pager [phone]  If 7PM-7AM, please contact night-coverage [email or web address] Password [email or web address] 10/22/2017, 8:14 AM

## 2017-10-28 DIAGNOSIS — I129 Hypertensive chronic kidney disease with stage 1 through stage 4 chronic kidney disease, or unspecified chronic kidney disease: Secondary | ICD-10-CM | POA: Diagnosis not present

## 2017-10-28 DIAGNOSIS — M3214 Glomerular disease in systemic lupus erythematosus: Secondary | ICD-10-CM | POA: Diagnosis not present

## 2017-10-28 DIAGNOSIS — R809 Proteinuria, unspecified: Secondary | ICD-10-CM | POA: Diagnosis not present

## 2017-11-02 DIAGNOSIS — M3212 Pericarditis in systemic lupus erythematosus: Secondary | ICD-10-CM | POA: Diagnosis not present

## 2017-11-02 DIAGNOSIS — M329 Systemic lupus erythematosus, unspecified: Secondary | ICD-10-CM | POA: Diagnosis not present

## 2017-11-02 DIAGNOSIS — M3214 Glomerular disease in systemic lupus erythematosus: Secondary | ICD-10-CM | POA: Diagnosis not present

## 2017-11-27 DIAGNOSIS — Z79899 Other long term (current) drug therapy: Secondary | ICD-10-CM | POA: Diagnosis not present

## 2017-11-27 DIAGNOSIS — M329 Systemic lupus erythematosus, unspecified: Secondary | ICD-10-CM | POA: Diagnosis not present

## 2017-12-02 ENCOUNTER — Ambulatory Visit: Payer: 59 | Admitting: Cardiovascular Disease

## 2017-12-29 DIAGNOSIS — M329 Systemic lupus erythematosus, unspecified: Secondary | ICD-10-CM | POA: Diagnosis not present

## 2017-12-29 DIAGNOSIS — M3214 Glomerular disease in systemic lupus erythematosus: Secondary | ICD-10-CM | POA: Diagnosis not present

## 2017-12-29 DIAGNOSIS — M3212 Pericarditis in systemic lupus erythematosus: Secondary | ICD-10-CM | POA: Diagnosis not present

## 2018-02-09 DIAGNOSIS — I129 Hypertensive chronic kidney disease with stage 1 through stage 4 chronic kidney disease, or unspecified chronic kidney disease: Secondary | ICD-10-CM | POA: Diagnosis not present

## 2018-02-09 DIAGNOSIS — R809 Proteinuria, unspecified: Secondary | ICD-10-CM | POA: Diagnosis not present

## 2018-02-09 DIAGNOSIS — M3214 Glomerular disease in systemic lupus erythematosus: Secondary | ICD-10-CM | POA: Diagnosis not present

## 2018-02-11 DIAGNOSIS — H00024 Hordeolum internum left upper eyelid: Secondary | ICD-10-CM | POA: Diagnosis not present

## 2018-03-16 DIAGNOSIS — M329 Systemic lupus erythematosus, unspecified: Secondary | ICD-10-CM | POA: Diagnosis not present

## 2018-03-16 DIAGNOSIS — M3212 Pericarditis in systemic lupus erythematosus: Secondary | ICD-10-CM | POA: Diagnosis not present

## 2018-03-16 DIAGNOSIS — M3214 Glomerular disease in systemic lupus erythematosus: Secondary | ICD-10-CM | POA: Diagnosis not present

## 2018-03-18 DIAGNOSIS — Z111 Encounter for screening for respiratory tuberculosis: Secondary | ICD-10-CM | POA: Diagnosis not present

## 2018-03-20 DIAGNOSIS — Z111 Encounter for screening for respiratory tuberculosis: Secondary | ICD-10-CM | POA: Diagnosis not present

## 2020-02-24 IMAGING — CR DG ABDOMEN 2V
2 series · 2 of 2 positions shown · non-contrast
Comparison: December 24, 2015

CLINICAL DATA: Abdominal pain and constipation

EXAM:
ABDOMEN - 2 VIEW

[w abdomen upright]
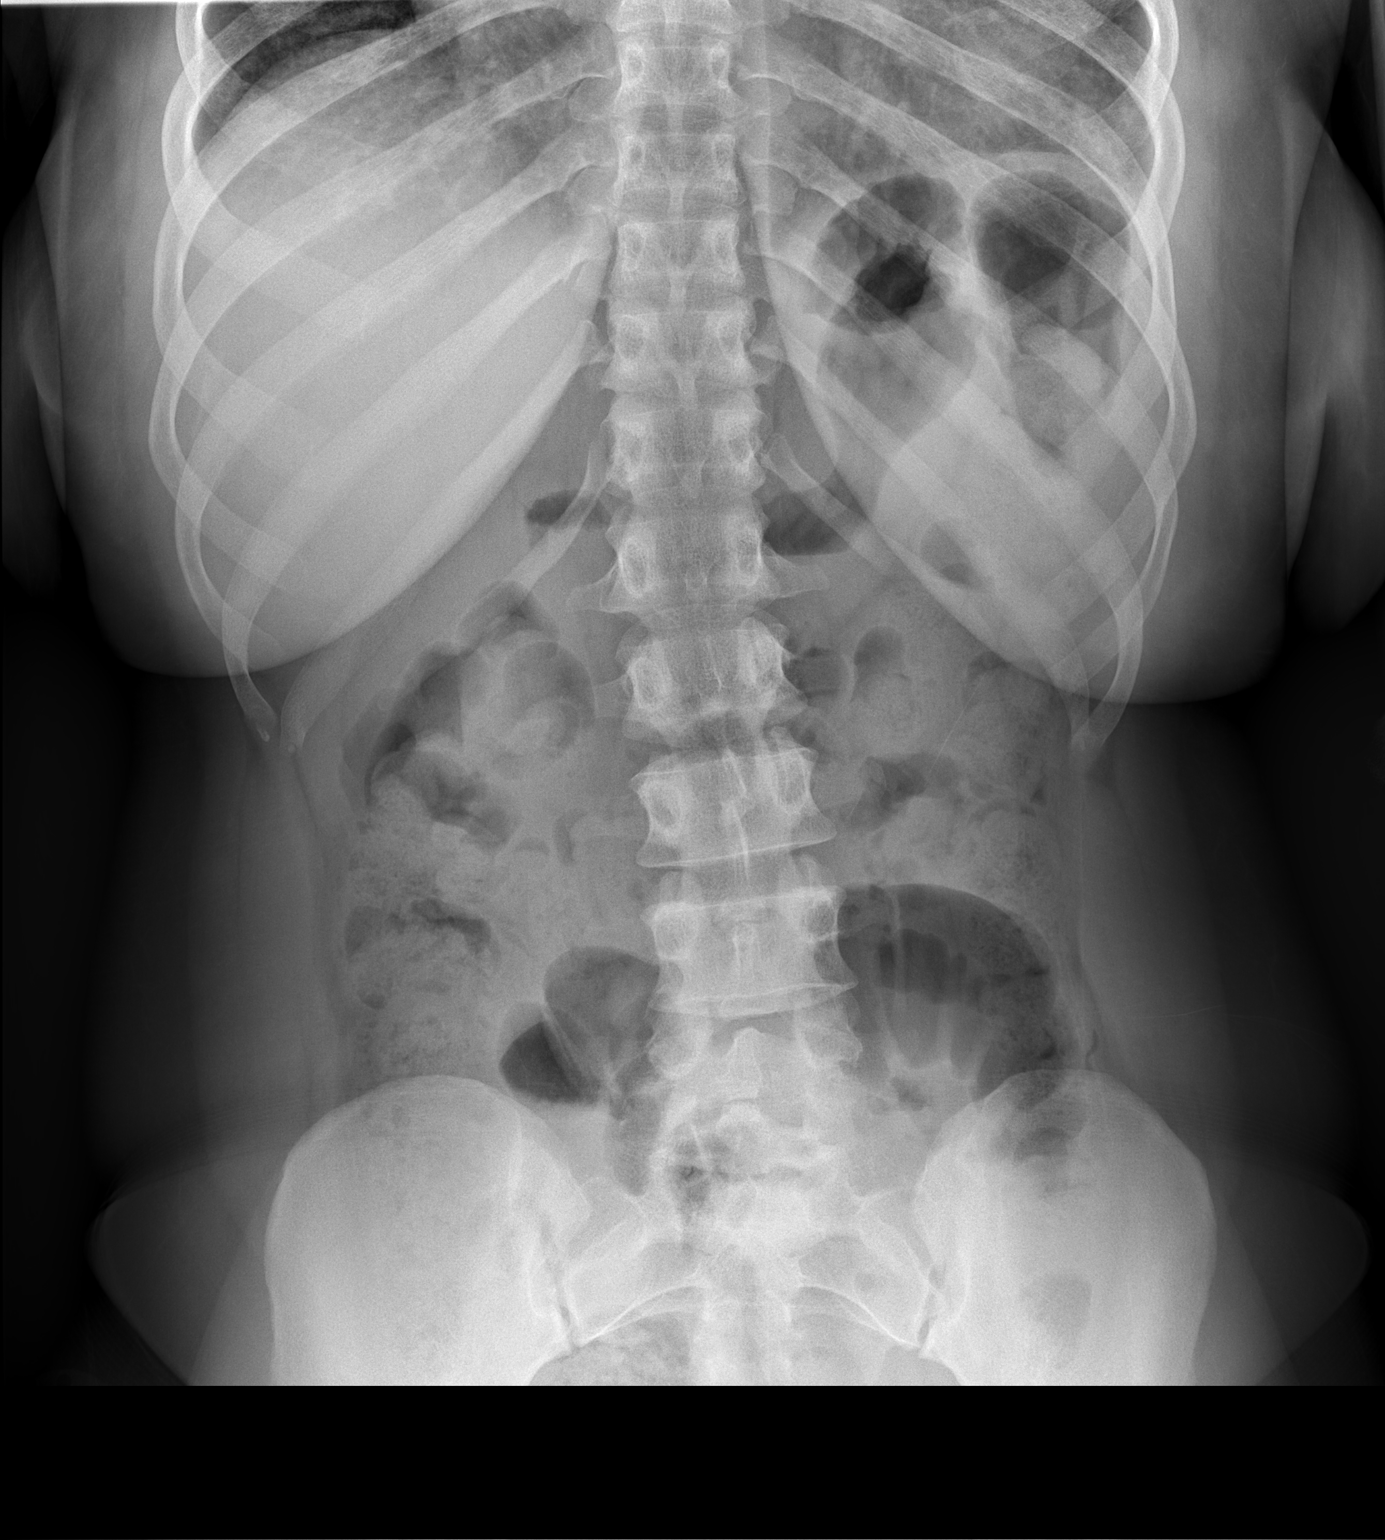

[t abdomen supine]
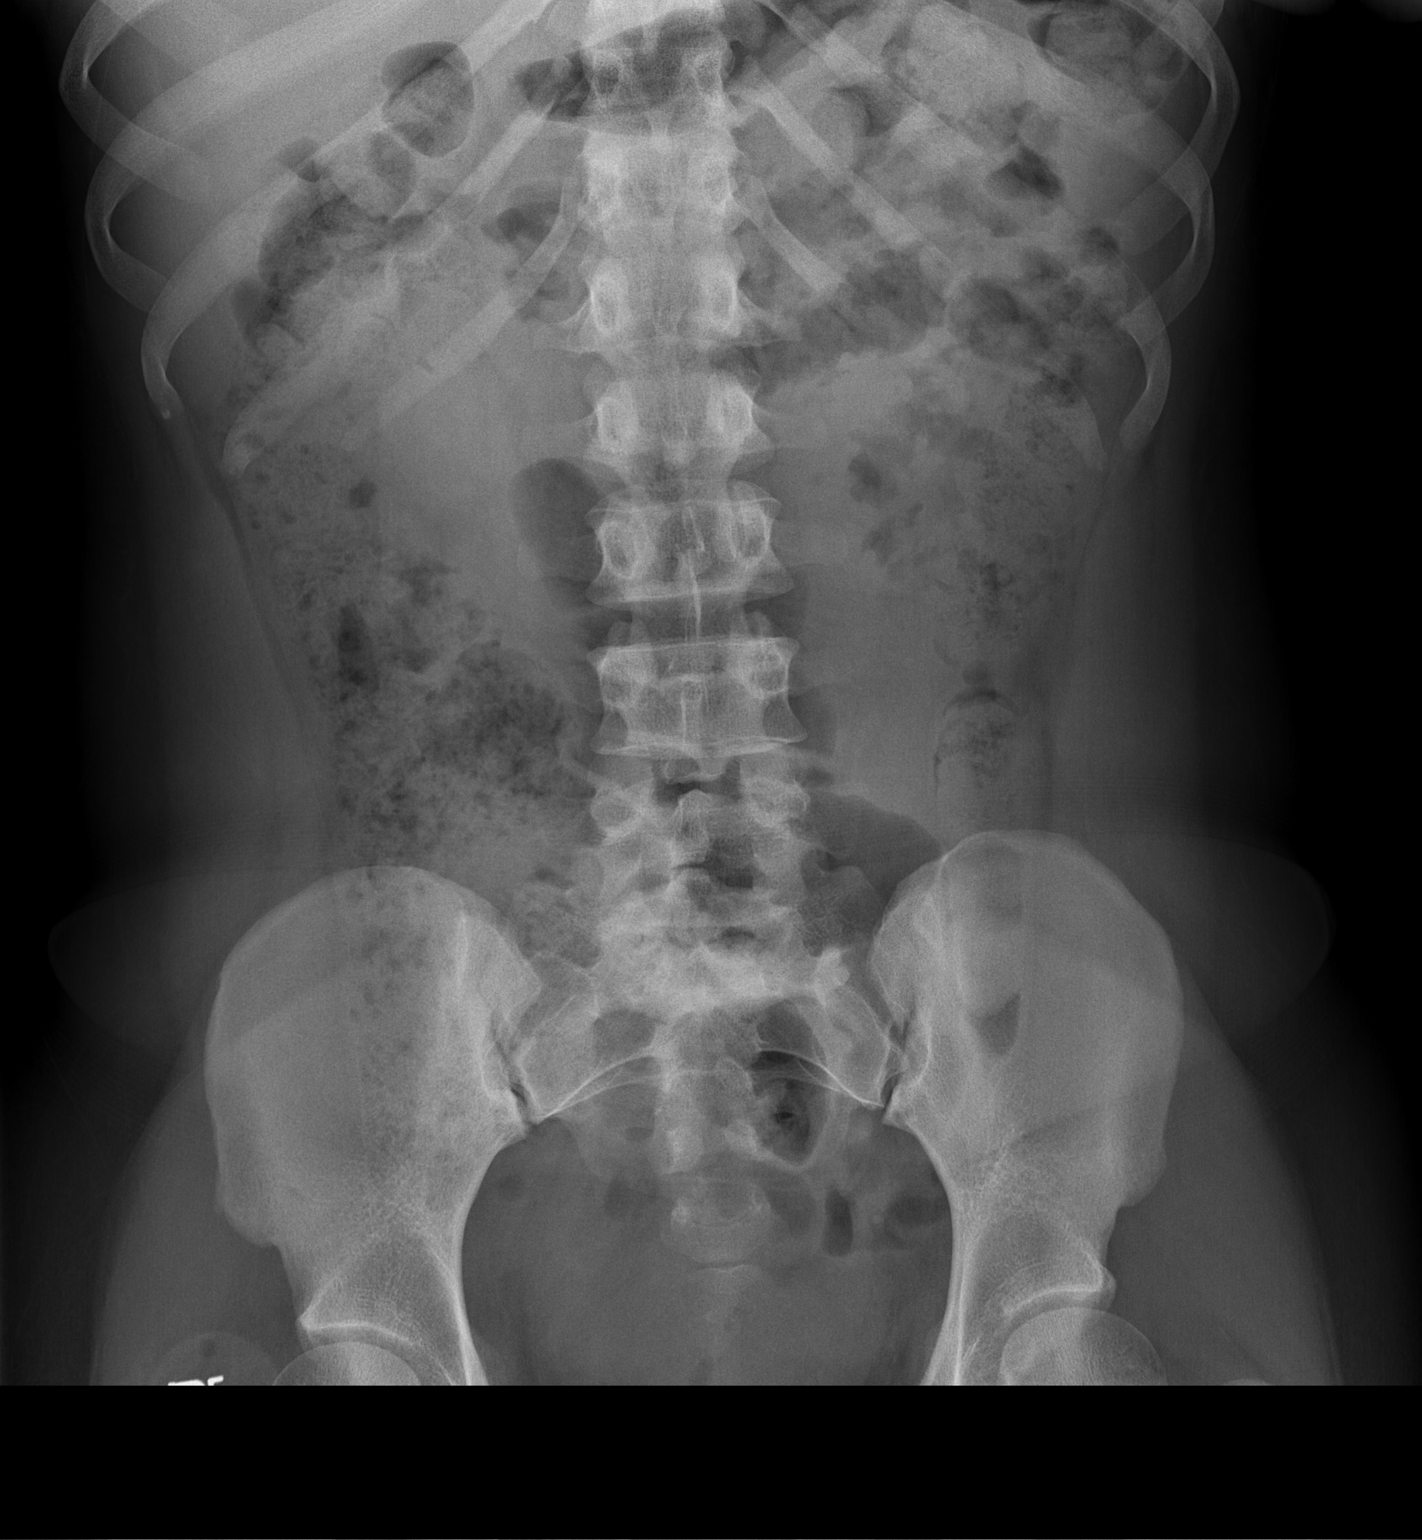

[2 of 2 positions shown; findings below may reference images not displayed]

FINDINGS: There is diffuse stool throughout colon. There is no appreciable
bowel dilatation or air-fluid level to suggest bowel obstruction. No
free air. Lung bases are clear. There are a few tiny phleboliths in
the pelvis.
IMPRESSION: Diffuse stool throughout colon consistent with constipation. No
bowel obstruction or free air evident.

## 2020-03-09 IMAGING — CT CT CHEST W/O CM
2 of 4 series · 12 of 36 positions shown, 15 images · non-contrast
Comparison: 05/24/2017.

CLINICAL DATA: Lung nodule, lupus, chronic cough.

EXAM:
CT CHEST WITHOUT CONTRAST
TECHNIQUE: Multidetector CT imaging of the chest was performed following the
standard protocol without IV contrast.

[Series 2: chest 2.00 br40 s3 ax · axial · 0.56mm/px · z∈[+1394,+1616]mm · 9 of 133 slices shown, 12 images]
[im 11/133  mediastinal]
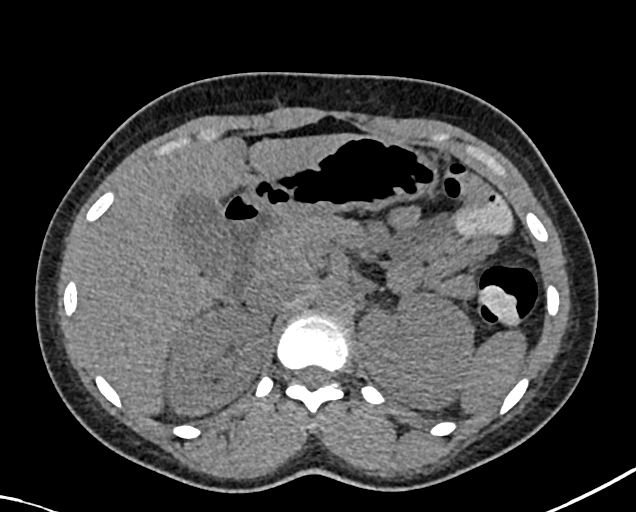
[im 11/133  lung]
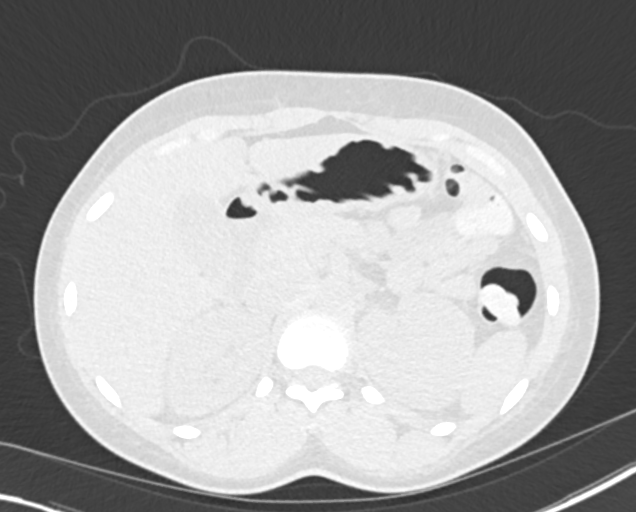
[im 31/133  lung]
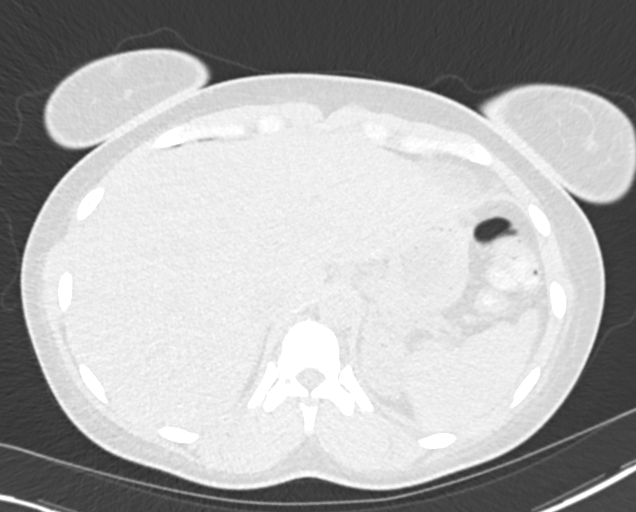
[im 41/133  lung]
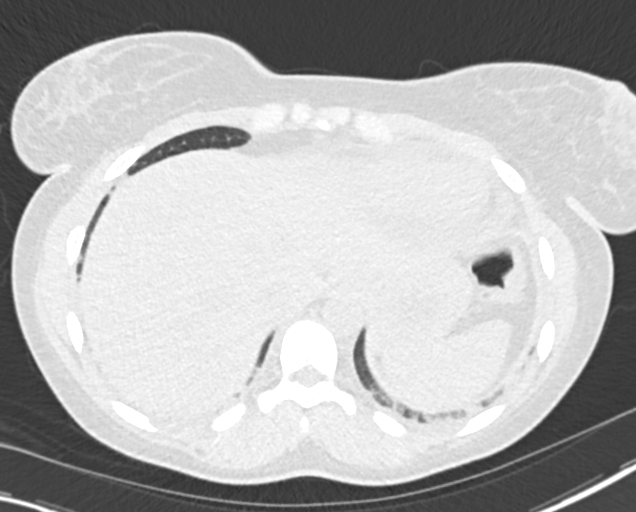
[im 51/133  lung]
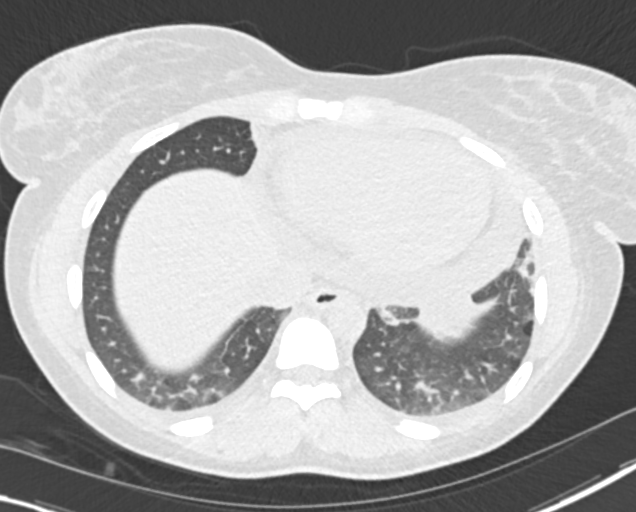
[im 72/133  mediastinal]
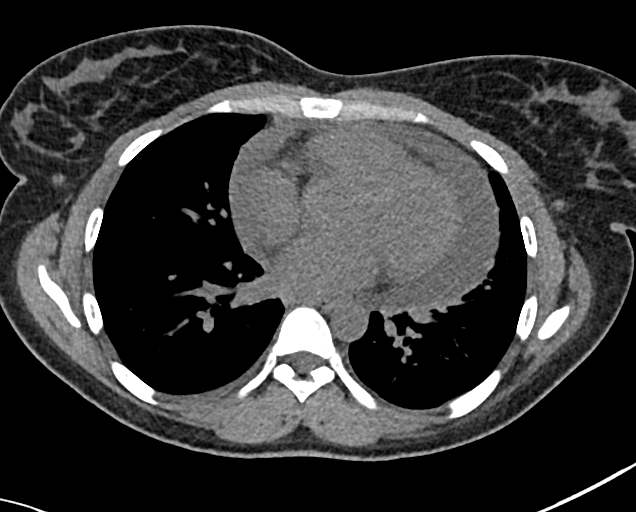
[im 72/133  lung]
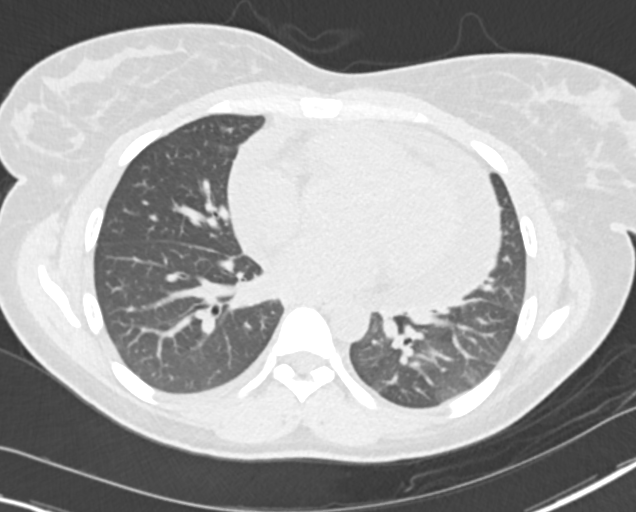
[im 82/133  lung]
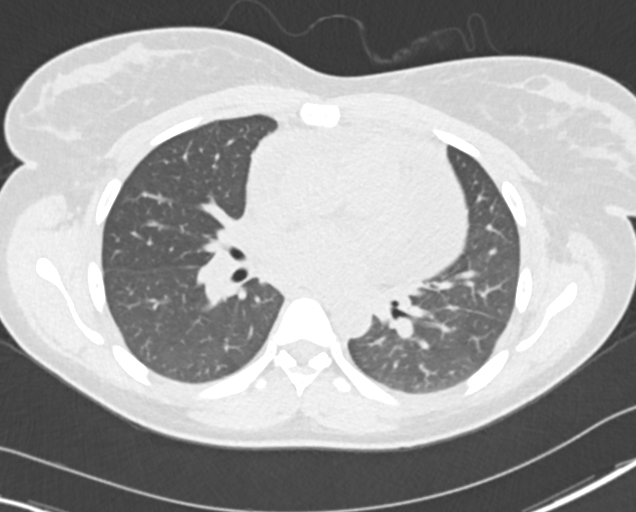
[im 92/133  lung]
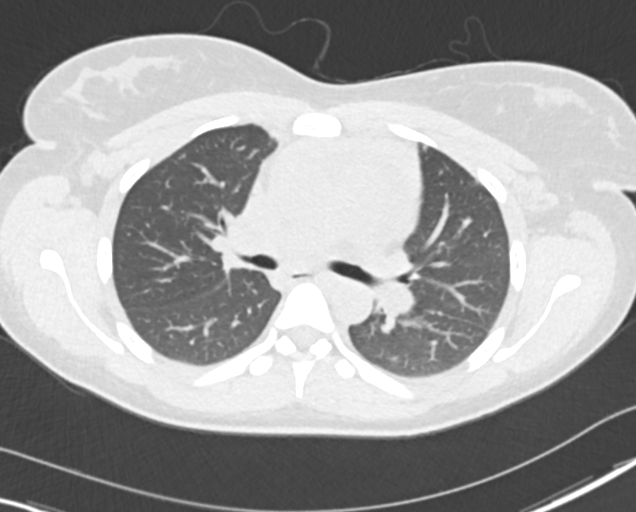
[im 112/133  lung]
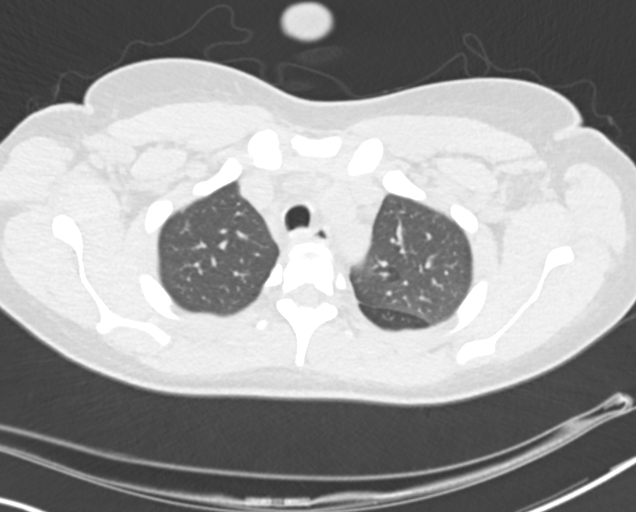
[im 122/133  mediastinal]
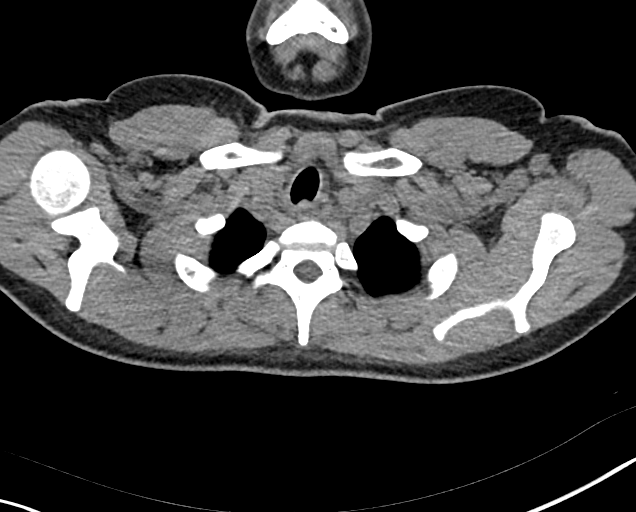
[im 122/133  lung]
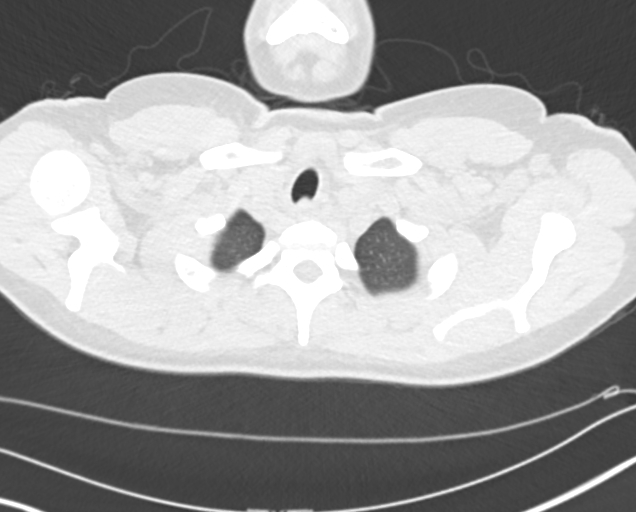

[Series 4: chest 2.00 br40 s3 cor · coronal · 0.52mm/px · 3 of 144 slices shown]
[im 29/144  lung]
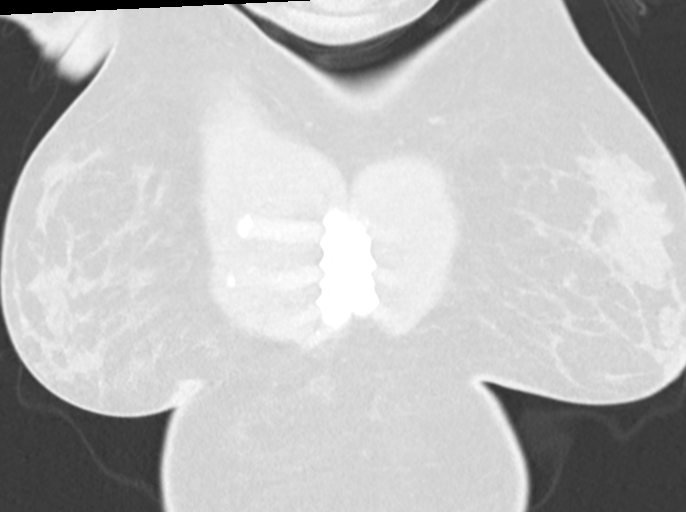
[im 58/144  lung]
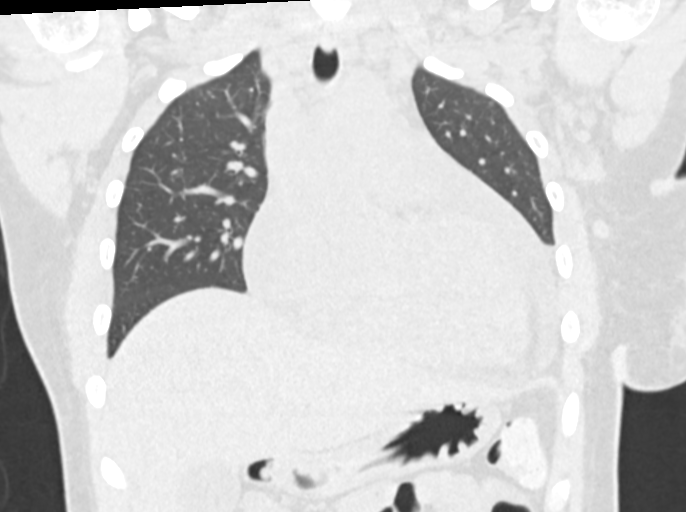
[im 86/144  lung]
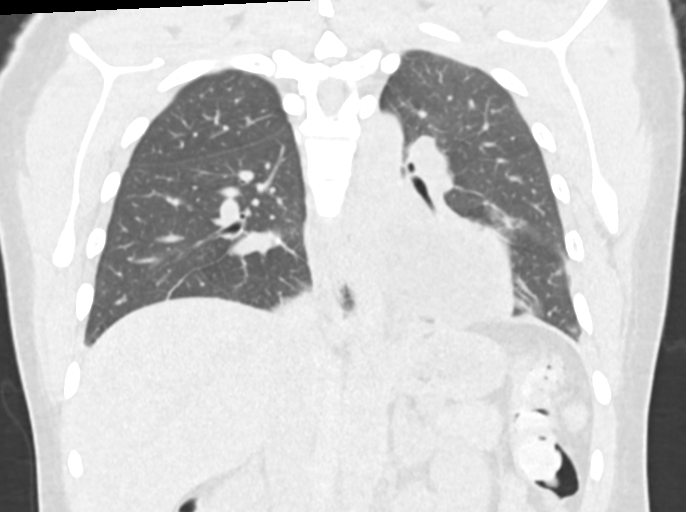

[12 of 36 positions shown; findings below may reference images not displayed]

FINDINGS: Cardiovascular: Moderate to large pericardial effusion is new. Heart
size is within normal limits.

Mediastinum/Nodes: Axillary lymph nodes measure up to 1.6 cm on the
left, increased from 1.2 cm. No definite mediastinal adenopathy.
Hilar regions are difficult to evaluate without IV contrast.
Esophagus is grossly unremarkable.

Lungs/Pleura: 6 mm left lower lobe nodule (series 8, image 73) is
unchanged. Image quality is degraded by expiratory phase imaging.
There are new scattered ill-defined nodular densities in the lungs,
seen predominantly peripherally. Smooth septal thickening is seen in
the inferior lower lobes. Trace bilateral pleural fluid. Airway is
unremarkable.

Upper Abdomen: Visualized portions of the liver, gallbladder,
adrenal glands, kidneys, spleen, pancreas, stomach and bowel are
grossly unremarkable.

Musculoskeletal: Negative.
IMPRESSION: 1. Moderate to large pericardial effusion, new from 05/24/2017.
These results will be called to the ordering clinician or
representative by the Radiologist Assistant, and communication
documented in the PACS or zVision Dashboard.
2. Suspect mild dependent pulmonary edema with trace bilateral
pleural effusions.
3. 6 mm left lower lobe nodule is unchanged. Scattered additional
new nodular densities bilaterally, possibly infectious or
inflammatory in etiology.
4. Enlarging axillary lymph nodes, possibly reactive. A
lymphoproliferative disorder cannot be excluded.
